# Patient Record
Sex: Female | Born: 1935 | Race: White | Hispanic: No | Marital: Married | Smoking: Never smoker
Health system: Southern US, Community
[De-identification: ages and names within clinical notes are randomized; demographics above are authoritative.]

## PROBLEM LIST (undated history)

## (undated) DIAGNOSIS — Z800 Family history of malignant neoplasm of digestive organs: Secondary | ICD-10-CM

## (undated) DIAGNOSIS — E785 Hyperlipidemia, unspecified: Secondary | ICD-10-CM

## (undated) DIAGNOSIS — K635 Polyp of colon: Secondary | ICD-10-CM

## (undated) DIAGNOSIS — I10 Essential (primary) hypertension: Secondary | ICD-10-CM

## (undated) DIAGNOSIS — K589 Irritable bowel syndrome without diarrhea: Secondary | ICD-10-CM

## (undated) HISTORY — PX: OTHER SURGICAL HISTORY: SHX169

## (undated) HISTORY — DX: Hyperlipidemia, unspecified: E78.5

## (undated) HISTORY — PX: CARPAL TUNNEL RELEASE: SHX101

## (undated) HISTORY — PX: CATARACT EXTRACTION, BILATERAL: SHX1313

## (undated) HISTORY — PX: KNEE ARTHROSCOPY: SUR90

## (undated) HISTORY — PX: TONSILLECTOMY AND ADENOIDECTOMY: SUR1326

## (undated) HISTORY — DX: Polyp of colon: K63.5

## (undated) HISTORY — DX: Essential (primary) hypertension: I10

## (undated) HISTORY — DX: Irritable bowel syndrome, unspecified: K58.9

## (undated) HISTORY — DX: Family history of malignant neoplasm of digestive organs: Z80.0

## (undated) HISTORY — PX: MASTOIDECTOMY: SHX711

---

## 1998-02-25 ENCOUNTER — Other Ambulatory Visit: Admission: RE | Admit: 1998-02-25 | Discharge: 1998-02-25 | Payer: Self-pay | Admitting: Obstetrics and Gynecology

## 1999-06-06 ENCOUNTER — Ambulatory Visit (HOSPITAL_COMMUNITY): Admission: RE | Admit: 1999-06-06 | Discharge: 1999-06-06 | Payer: Self-pay | Admitting: Orthopedic Surgery

## 1999-09-11 ENCOUNTER — Other Ambulatory Visit: Admission: RE | Admit: 1999-09-11 | Discharge: 1999-09-11 | Payer: Self-pay | Admitting: Gastroenterology

## 2000-10-07 ENCOUNTER — Other Ambulatory Visit: Admission: RE | Admit: 2000-10-07 | Discharge: 2000-10-07 | Payer: Self-pay | Admitting: Gastroenterology

## 2001-10-27 ENCOUNTER — Other Ambulatory Visit: Admission: RE | Admit: 2001-10-27 | Discharge: 2001-10-27 | Payer: Self-pay | Admitting: Gastroenterology

## 2002-11-18 ENCOUNTER — Other Ambulatory Visit: Admission: RE | Admit: 2002-11-18 | Discharge: 2002-11-18 | Payer: Self-pay | Admitting: Gastroenterology

## 2003-01-28 ENCOUNTER — Ambulatory Visit (HOSPITAL_COMMUNITY): Admission: RE | Admit: 2003-01-28 | Discharge: 2003-01-28 | Payer: Self-pay | Admitting: Gastroenterology

## 2005-02-16 ENCOUNTER — Other Ambulatory Visit: Admission: RE | Admit: 2005-02-16 | Discharge: 2005-02-16 | Payer: Self-pay | Admitting: Gastroenterology

## 2006-02-18 ENCOUNTER — Other Ambulatory Visit: Admission: RE | Admit: 2006-02-18 | Discharge: 2006-02-18 | Payer: Self-pay | Admitting: Gastroenterology

## 2007-07-31 ENCOUNTER — Ambulatory Visit (HOSPITAL_COMMUNITY): Admission: RE | Admit: 2007-07-31 | Discharge: 2007-07-31 | Payer: Self-pay | Admitting: Orthopedic Surgery

## 2007-07-31 ENCOUNTER — Encounter (INDEPENDENT_AMBULATORY_CARE_PROVIDER_SITE_OTHER): Payer: Self-pay | Admitting: Orthopedic Surgery

## 2007-10-23 DIAGNOSIS — K635 Polyp of colon: Secondary | ICD-10-CM

## 2007-10-23 HISTORY — DX: Polyp of colon: K63.5

## 2007-11-03 ENCOUNTER — Encounter: Payer: Self-pay | Admitting: Internal Medicine

## 2008-04-12 ENCOUNTER — Encounter: Payer: Self-pay | Admitting: Internal Medicine

## 2008-04-12 ENCOUNTER — Other Ambulatory Visit: Admission: RE | Admit: 2008-04-12 | Discharge: 2008-04-12 | Payer: Self-pay | Admitting: Gastroenterology

## 2008-04-30 ENCOUNTER — Emergency Department (HOSPITAL_COMMUNITY): Admission: EM | Admit: 2008-04-30 | Discharge: 2008-04-30 | Payer: Self-pay | Admitting: Family Medicine

## 2008-05-07 ENCOUNTER — Emergency Department (HOSPITAL_COMMUNITY): Admission: EM | Admit: 2008-05-07 | Discharge: 2008-05-07 | Payer: Self-pay | Admitting: Emergency Medicine

## 2008-08-25 ENCOUNTER — Encounter: Payer: Self-pay | Admitting: Internal Medicine

## 2008-08-25 LAB — HM COLONOSCOPY

## 2008-11-08 ENCOUNTER — Ambulatory Visit: Payer: Self-pay | Admitting: Internal Medicine

## 2008-11-08 DIAGNOSIS — Z86010 Personal history of colonic polyps: Secondary | ICD-10-CM

## 2008-11-08 DIAGNOSIS — E785 Hyperlipidemia, unspecified: Secondary | ICD-10-CM

## 2008-11-08 DIAGNOSIS — I10 Essential (primary) hypertension: Secondary | ICD-10-CM

## 2008-11-08 DIAGNOSIS — Z961 Presence of intraocular lens: Secondary | ICD-10-CM

## 2008-11-08 DIAGNOSIS — Z9889 Other specified postprocedural states: Secondary | ICD-10-CM

## 2008-11-08 DIAGNOSIS — K589 Irritable bowel syndrome without diarrhea: Secondary | ICD-10-CM | POA: Insufficient documentation

## 2008-11-09 ENCOUNTER — Encounter: Payer: Self-pay | Admitting: Internal Medicine

## 2008-11-20 ENCOUNTER — Emergency Department (HOSPITAL_COMMUNITY): Admission: EM | Admit: 2008-11-20 | Discharge: 2008-11-20 | Payer: Self-pay | Admitting: Emergency Medicine

## 2008-11-23 ENCOUNTER — Telehealth (INDEPENDENT_AMBULATORY_CARE_PROVIDER_SITE_OTHER): Payer: Self-pay | Admitting: *Deleted

## 2008-11-24 ENCOUNTER — Ambulatory Visit: Payer: Self-pay | Admitting: Internal Medicine

## 2008-11-24 DIAGNOSIS — R55 Syncope and collapse: Secondary | ICD-10-CM

## 2008-12-02 ENCOUNTER — Telehealth (INDEPENDENT_AMBULATORY_CARE_PROVIDER_SITE_OTHER): Payer: Self-pay | Admitting: *Deleted

## 2009-01-31 ENCOUNTER — Ambulatory Visit: Payer: Self-pay | Admitting: Internal Medicine

## 2009-02-02 LAB — CONVERTED CEMR LAB: Potassium: 4.9 meq/L (ref 3.5–5.1)

## 2009-02-03 ENCOUNTER — Encounter (INDEPENDENT_AMBULATORY_CARE_PROVIDER_SITE_OTHER): Payer: Self-pay | Admitting: *Deleted

## 2009-03-02 ENCOUNTER — Telehealth (INDEPENDENT_AMBULATORY_CARE_PROVIDER_SITE_OTHER): Payer: Self-pay | Admitting: *Deleted

## 2009-09-12 ENCOUNTER — Telehealth (INDEPENDENT_AMBULATORY_CARE_PROVIDER_SITE_OTHER): Payer: Self-pay | Admitting: *Deleted

## 2009-09-19 ENCOUNTER — Ambulatory Visit: Payer: Self-pay | Admitting: Internal Medicine

## 2009-09-19 LAB — CONVERTED CEMR LAB
AST: 16 units/L (ref 0–37)
Basophils Absolute: 0.1 10*3/uL (ref 0.0–0.1)
Bilirubin, Direct: 0.1 mg/dL (ref 0.0–0.3)
Creatinine, Ser: 0.8 mg/dL (ref 0.4–1.2)
Direct LDL: 116.9 mg/dL
Eosinophils Absolute: 0.2 10*3/uL (ref 0.0–0.7)
GFR calc non Af Amer: 73.06 mL/min (ref >60)
Hemoglobin: 13.3 g/dL (ref 12.0–15.0)
MCHC: 33.7 g/dL (ref 30.0–36.0)
MCV: 90.3 fL (ref 78.0–100.0)
Monocytes Absolute: 0.7 10*3/uL (ref 0.1–1.0)
Monocytes Relative: 15.8 % — ABNORMAL HIGH (ref 3.0–12.0)
RBC: 4.36 M/uL (ref 3.87–5.11)
TSH: 2.21 microintl units/mL (ref 0.35–5.50)
Total Protein: 6.5 g/dL (ref 6.0–8.3)
Triglycerides: 64 mg/dL (ref 0.0–149.0)
VLDL: 12.8 mg/dL (ref 0.0–40.0)
WBC: 4.2 10*3/uL — ABNORMAL LOW (ref 4.5–10.5)

## 2009-09-23 ENCOUNTER — Ambulatory Visit: Payer: Self-pay | Admitting: Internal Medicine

## 2009-09-23 LAB — CONVERTED CEMR LAB: HDL goal, serum: 40 mg/dL

## 2009-09-29 ENCOUNTER — Telehealth (INDEPENDENT_AMBULATORY_CARE_PROVIDER_SITE_OTHER): Payer: Self-pay | Admitting: *Deleted

## 2009-10-25 ENCOUNTER — Telehealth (INDEPENDENT_AMBULATORY_CARE_PROVIDER_SITE_OTHER): Payer: Self-pay | Admitting: *Deleted

## 2010-01-23 ENCOUNTER — Telehealth (INDEPENDENT_AMBULATORY_CARE_PROVIDER_SITE_OTHER): Payer: Self-pay | Admitting: *Deleted

## 2010-01-30 ENCOUNTER — Ambulatory Visit: Payer: Self-pay | Admitting: Family Medicine

## 2010-02-01 LAB — CONVERTED CEMR LAB
BUN: 14 mg/dL (ref 6–23)
CO2: 30 meq/L (ref 19–32)
Chloride: 101 meq/L (ref 96–112)
GFR calc non Af Amer: 73 mL/min (ref >60)

## 2010-02-03 ENCOUNTER — Ambulatory Visit: Payer: Self-pay | Admitting: Family Medicine

## 2010-02-13 ENCOUNTER — Telehealth (INDEPENDENT_AMBULATORY_CARE_PROVIDER_SITE_OTHER): Payer: Self-pay | Admitting: *Deleted

## 2010-02-14 ENCOUNTER — Ambulatory Visit: Payer: Self-pay | Admitting: Internal Medicine

## 2010-02-14 DIAGNOSIS — H7290 Unspecified perforation of tympanic membrane, unspecified ear: Secondary | ICD-10-CM | POA: Insufficient documentation

## 2010-02-15 ENCOUNTER — Encounter: Payer: Self-pay | Admitting: Internal Medicine

## 2010-03-09 ENCOUNTER — Encounter: Payer: Self-pay | Admitting: Internal Medicine

## 2010-03-13 ENCOUNTER — Telehealth (INDEPENDENT_AMBULATORY_CARE_PROVIDER_SITE_OTHER): Payer: Self-pay | Admitting: *Deleted

## 2010-03-14 ENCOUNTER — Telehealth (INDEPENDENT_AMBULATORY_CARE_PROVIDER_SITE_OTHER): Payer: Self-pay | Admitting: *Deleted

## 2010-03-17 ENCOUNTER — Telehealth (INDEPENDENT_AMBULATORY_CARE_PROVIDER_SITE_OTHER): Payer: Self-pay | Admitting: *Deleted

## 2010-03-24 ENCOUNTER — Telehealth (INDEPENDENT_AMBULATORY_CARE_PROVIDER_SITE_OTHER): Payer: Self-pay | Admitting: *Deleted

## 2010-06-02 ENCOUNTER — Telehealth (INDEPENDENT_AMBULATORY_CARE_PROVIDER_SITE_OTHER): Payer: Self-pay | Admitting: *Deleted

## 2010-06-20 ENCOUNTER — Encounter: Payer: Self-pay | Admitting: Internal Medicine

## 2010-09-19 ENCOUNTER — Telehealth (INDEPENDENT_AMBULATORY_CARE_PROVIDER_SITE_OTHER): Payer: Self-pay | Admitting: *Deleted

## 2010-11-16 ENCOUNTER — Other Ambulatory Visit: Payer: Self-pay | Admitting: Internal Medicine

## 2010-11-16 ENCOUNTER — Ambulatory Visit
Admission: RE | Admit: 2010-11-16 | Discharge: 2010-11-16 | Payer: Self-pay | Source: Home / Self Care | Attending: Internal Medicine | Admitting: Internal Medicine

## 2010-11-16 ENCOUNTER — Telehealth: Payer: Self-pay | Admitting: Internal Medicine

## 2010-11-16 LAB — BASIC METABOLIC PANEL
CO2: 29 mEq/L (ref 19–32)
Creatinine, Ser: 0.9 mg/dL (ref 0.4–1.2)
Glucose, Bld: 83 mg/dL (ref 70–99)
Potassium: 5.2 mEq/L — ABNORMAL HIGH (ref 3.5–5.1)

## 2010-11-16 LAB — CBC WITH DIFFERENTIAL/PLATELET
HCT: 39.6 % (ref 36.0–46.0)
Lymphocytes Relative: 29.1 % (ref 12.0–46.0)
MCHC: 34.2 g/dL (ref 30.0–36.0)
MCV: 88.6 fl (ref 78.0–100.0)
Neutro Abs: 2.4 10*3/uL (ref 1.4–7.7)
Platelets: 228 10*3/uL (ref 150.0–400.0)
RBC: 4.47 Mil/uL (ref 3.87–5.11)
RDW: 13.5 % (ref 11.5–14.6)

## 2010-11-16 LAB — HEPATIC FUNCTION PANEL
ALT: 13 U/L (ref 0–35)
AST: 18 U/L (ref 0–37)
Total Bilirubin: 0.7 mg/dL (ref 0.3–1.2)

## 2010-11-16 LAB — LIPID PANEL
HDL: 69.9 mg/dL (ref >39.00)
LDL Cholesterol: 110 mg/dL — ABNORMAL HIGH (ref 0–99)
Total CHOL/HDL Ratio: 3
Triglycerides: 85 mg/dL (ref 0.0–149.0)

## 2010-11-16 LAB — TSH: TSH: 2.38 u[IU]/mL (ref 0.35–5.50)

## 2010-11-21 NOTE — Progress Notes (Signed)
  Phone Note Call from Patient Call back at Home Phone [phone] Call back at Work Phone [phone]   Caller: Patient Summary of Call: pt called flying to [place] this weekend. Had concernes about her ears, seen 02/03/10 and had wax removed.  --No infection and ears feel fine now, wanted to know ok to fly, pt has Cortisporin drops, has appt sched wed 02/15/10 with Dr Alwyn Ren. Informed pt can chew gum for popping of ears, also has abx for ears can take with her in case of symptoms, Has office visit with Dr Alwyn Ren on Wed,  can discuss further options, wife agreed .Kandice Hams  February 13, 2010 4:53 PM  Initial call taken by: Kandice Hams,  February 13, 2010 4:54 PM

## 2010-11-21 NOTE — Progress Notes (Signed)
Summary: REFILL REQUEST  Phone Note Refill Request Message from:  Patient on June 02, 2010 11:28 AM  Refills Requested: Medication #1:  LOSARTAN POTASSIUM 100 MG TABS 1 once daily.   Dosage confirmed as above?Dosage Confirmed   Supply Requested: 3 months   Last Refilled: 03/17/2010 [facility]  Next Appointment Scheduled: NONE Initial call taken by: Lavell Islam,  June 02, 2010 11:29 AM    Prescriptions: LOSARTAN POTASSIUM 100 MG TABS (LOSARTAN POTASSIUM) 1 once daily  #90 x 2   Entered by:   Shonna Chock CMA   Authorized by:   Marga Melnick MD   Signed by:   Shonna Chock CMA on 06/02/2010   Method used:   Faxed to ...       [facility] (mail-order)             , [place]         Ph: [phone]       Fax: [phone]   RxID:   [id number]

## 2010-11-21 NOTE — Progress Notes (Signed)
Summary: RAMIPRIL CAPS 10 MG REFILL  Phone Note Call from Patient Call back at Home Phone [phone]   Caller: Patient Summary of Call: PATIENT DOES NEED YOU TO FAX A REQUEST TO [facility] FOR HER RAMIPRIL 10 MG FOR 90 DAYS PLUS 3 REFILLS---BOTTLE FROM [facility] DATED 10/27/2009 STATES "0 REFILLS" FOR THIS MEDICATION---PLEASE FAX IT TODAY  WILL WAIT TO REQUEST LOCAL REFILL--MAY NOT BE NECESSARY  WILL SEND [facility] PAPERWORK TO [place] [facility] [place] Initial call taken by: Jerolyn Shin,  January 23, 2010 3:39 PM    Prescriptions: ALTACE 10 MG CAPS (RAMIPRIL) 1 by mouth once daily  #90 x 2   Entered by:   Shonna Chock   Authorized by:   Marga Melnick MD   Signed by:   Shonna Chock on 01/23/2010   Method used:   Printed then faxed to ...       [facility] (mail-order)             ,          Ph: [phone]       Fax: [phone]   RxID:   [id number]

## 2010-11-21 NOTE — Progress Notes (Signed)
Summary: Medication Concerns  Phone Note From Pharmacy Call back at [phone] (2)   Caller: [facility] Summary of Call: Message left on VM: Calling to address Altace RX- Ref Number: [id number]  1.) Instruction indicated two times a day and due to age the pharmacy recommends 1 once daily or a different Med  2.) ? Dispense number, if med was for two times a day why was patient only given #90 instead of #180?  Chrae Malloy  Mar 17, 2010 11:15 AM   Follow-up for Phone Call        Dr.Hopper please advise if you are ok with patient taking med two times a day or will you change to another med? Follow-up by: Shonna Chock,  Mar 17, 2010 11:16 AM  Additional Follow-up for Phone Call Additional follow up Details #1::        left message to call office...................Marland KitchenFelecia Deloach CMA  Mar 17, 2010 2:43 PM  left message to call  office.........Marland KitchenFelecia Deloach CMA  Mar 21, 2010 10:21 AM   LEFT MESSAGE FOR PT TO CALL IN AM but advise pt husband to have pt take 1 tab instead of 2..............Marland KitchenFelecia Deloach CMA  Mar 21, 2010 5:07 PM     Additional Follow-up for Phone Call Additional follow up Details #2::    Pt states that she just received #60 of A LTACE 10 MG CAPS 2 by mouth once daily from local pharmacy and would like to know if she needs to continue this med until she received new med in mail. pls advise...Marland KitchenMarland KitchenFelecia Deloach CMA  Mar 21, 2010 4:43 PM    Additional Follow-up for Phone Call Additional follow up Details #3:: Details for Additional Follow-up Action Taken: continue Altace 10 mg once daily until new Rx comes in  [place] with pt this am, informed of Dr Alwyn Ren recommendations, take 1 Altace until new rx comes in.  Pt agreed .Kandice Hams  March 22, 2010 8:47 AM  Additional Follow-up by: Marga Melnick MD,  Mar 21, 2010 5:06 PM  New/Updated Medications: LOSARTAN POTASSIUM 100 MG TABS (LOSARTAN POTASSIUM) 1 once daily Prescriptions: LOSARTAN POTASSIUM 100 MG TABS  (LOSARTAN POTASSIUM) 1 once daily  #90 x 0   Entered by:   Jeremy Johann CMA   Authorized by:   Marga Melnick MD   Signed by:   Jeremy Johann CMA on 03/17/2010   Method used:   Faxed to ...       [facility] (mail-order)             ,          Ph: [phone]       Fax: [phone]   RxID:   [id number]

## 2010-11-21 NOTE — Progress Notes (Signed)
Summary: Refill Request  Phone Note Refill Request Message from:  Patient  Refills Requested: Medication #1:  ALTACE 10 MG CAPS 1 by mouth once daily [facility]   Method Requested: Fax to [facility] Initial call taken by: Shonna Chock,  October 25, 2009 10:27 AM    Prescriptions: ALTACE 10 MG CAPS (RAMIPRIL) 1 by mouth once daily  #90 x 3   Entered by:   Shonna Chock   Authorized by:   Marga Melnick MD   Signed by:   Shonna Chock on 10/25/2009   Method used:   Faxed to ...       [facility] (mail-order)             ,          Ph: [phone]       Fax: [phone]   RxID:   [id number]

## 2010-11-21 NOTE — Progress Notes (Signed)
Summary: Altace rx  Phone Note Refill Request Call back at Work Phone [phone] Call back at cell   Refills Requested: Medication #1:  ALTACE 10 MG CAPS 2 by mouth once daily pt called in ref to Altace rx--; will be using [facility] but cannot fill until 03/31/10. Has been doubling up on med because rx is now two times a day. Wanted rx for 12 days. Called and spoke with pharmacist, who says it is Still a $10 copay for 24 pills or 60.  left msg for pt will leave rx for 60 pills and pharmacist has rx ready. will send refills to [facility]  Initial call taken by: Kandice Hams,  Mar 14, 2010 4:39 PM Caller: Patient    Prescriptions: ALTACE 10 MG CAPS (RAMIPRIL) 2 by mouth once daily  #90 x 0   Entered by:   Kandice Hams   Authorized by:   Marga Melnick MD   Signed by:   Kandice Hams on 03/14/2010   Method used:   Re-Faxed to ...       [facility] (mail-order)             ,          Ph: [phone]       Fax: [phone]   RxID:   [id number]

## 2010-11-21 NOTE — Progress Notes (Signed)
Summary: losartan recevied   Phone Note Call from Patient   Summary of Call: Pt called and left a voicemail stating she received the Losartan today. Pt will start medication tomorrow, she will call and let us know if she has any problems with this medication. Army Fossa CMA  March 24, 2010 11:48 AM

## 2010-11-21 NOTE — Assessment & Plan Note (Signed)
Summary: elevated bp/alr   Vital Signs:  Patient profile:   77 year old female Height:      65.6 inches Weight:      162 pounds BMI:     26.56 Pulse rate:   74 / minute BP sitting:   150 / 84  (left arm)  Vitals Entered By: Doristine Devoid (January 30, 2010 3:53 PM) CC: elevated bp was 208/136 this am  Comments -rechecked bp w/ patient cuff at office visit was 155/98   History of Present Illness: 77 yo woman here today for elevated BP.  took BP yesterday and it was over 200- admits to being nervous prior to reading.  pt's cuff reasonably correlates w/ office reading (150/84 office vs 155/98 pt's cuff).  pt reports she was cleaning the fridge  last week and was leaning forward for at least 15 minutes and felt she was going to pass out.  when she checked BP reading it was high but pt says she was panicking about feeling faint.  no CP, SOB, HAs, visual changes.  has felt as if 'my head is in a vice'.  will go walking and feels 'great'.  also complains of ears feeling 'full'.  has had trouble w/ wax in past.  some pain in R ear.  Current Medications (verified): 1)  Altace 10 Mg Caps (Ramipril) .Marland Kitchen.. 1 By Mouth Once Daily 2)  Lipitor 10 Mg Tabs (Atorvastatin Calcium) .Marland Kitchen.. 1 By Mouth Once Daily 3)  Triamterene-Hctz 37.5-25 Mg Tabs (Triamterene-Hctz) .... 1/2 By Mouth Once Daily 4)  Adult Aspirin Low Strength 81 Mg Tbdp (Aspirin) .... 2 By Mouth At Bedtime 5)  Toprol Xl 50 Mg Xr24h-Tab (Metoprolol Succinate) .Marland Kitchen.. 1 By Mouth Once Daily 6)  Cortisporin-Tc 3.12-22-08-0.5 Mg/ml Susp (Neomycin-Colist-Hc-Thonzonium) .... 5 Drops in Affected Ear Three Times A Day X7 Days  Allergies (verified): No Known Drug Allergies  Past History:  Past Medical History: Last updated: 11/08/2008 Colonic polyps, hx of Hyperlipidemia Hypertension IBS  Social History: Last updated: 09/23/2009 Married Never Smoked Alcohol use-yes Walking 1.5 - 4X/week   Review of Systems General:  Denies chills,  fatigue, fever, and malaise. Eyes:  Denies blurring and double vision. ENT:  Complains of decreased hearing, earache, and ringing in ears; denies ear discharge. CV:  Denies chest pain or discomfort, fainting, palpitations, shortness of breath with exertion, swelling of feet, and swelling of hands. Resp:  Denies cough. GI:  Denies nausea and vomiting. Neuro:  Complains of headaches.  Physical Exam  General:  Appears younger than age,in no acute distress; alert,appropriate and cooperative throughout examination Head:  NCAT Eyes:  PERRL, EOMI, fundi WNL Ears:  TMs obscurred by wax bilaterally.  Attempts at currette unsuccessful  s/p irrigation and wax removal on R canal is red, some blood, fluid bubble vs circular TM perforation on central TM.  TM otherwise WNL Neck:  No deformities, masses, or tenderness noted.  Lungs:  Normal respiratory effort, chest expands symmetrically. Lungs are clear to auscultation, no crackles or wheezes. Heart:  normal rate, regular rhythm, no gallop, no rub, no JVD, no HJR, and grade 1/6 systolic murmur.   Pulses:  R and L carotid,radial,dorsalis pedis and posterior tibial pulses are full and equal bilaterally Extremities:  No clubbing, cyanosis, edema.   Impression & Recommendations:  Problem # 1:  HYPERTENSION (ICD-401.9) Assessment Deteriorated having episodes of elevated BP.  pt very anxious about this.  increase Altace to 2 pills daily.  EKG w/out acute findings.  check BMP to  assess renal fxn.  reviewed red flags that should prompt immediate return- pt aware and in agreement. Her updated medication list for this problem includes:    Altace 10 Mg Caps (Ramipril) .Marland Kitchen... 1 by mouth once daily    Triamterene-hctz 37.5-25 Mg Tabs (Triamterene-hctz) .Marland Kitchen... 1/2 by mouth once daily    Toprol Xl 50 Mg Xr24h-tab (Metoprolol succinate) .Marland Kitchen... 1 by mouth once daily  Orders: Venipuncture (65784) EKG w/ Interpretation (93000) TLB-BMP (Basic Metabolic Panel-BMET)  (80048-METABOL)  Problem # 2:  IMPACTED CERUMEN (ICD-380.4) Assessment: Unchanged  attempts at currette were unsuccessful- ears irrigated bilaterally.  R ear now clear but canal red, painful, and w/ fluid bubble vs TM over central TM.  tx as otitis externa.  L ear unsuccessfully irrigated.  pt to use hydrogen peroxide at home to soften wax.  will recheck when pt returns at end of week for BP.  Pt expresses understanding and is in agreement w/ this plan.  Orders: Cerumen Impaction Removal (69629)  Complete Medication List: 1)  Altace 10 Mg Caps (Ramipril) .Marland Kitchen.. 1 by mouth once daily 2)  Lipitor 10 Mg Tabs (Atorvastatin calcium) .Marland Kitchen.. 1 by mouth once daily 3)  Triamterene-hctz 37.5-25 Mg Tabs (Triamterene-hctz) .... 1/2 by mouth once daily 4)  Adult Aspirin Low Strength 81 Mg Tbdp (Aspirin) .... 2 by mouth at bedtime 5)  Toprol Xl 50 Mg Xr24h-tab (Metoprolol succinate) .Marland Kitchen.. 1 by mouth once daily 6)  Cortisporin-tc 3.12-22-08-0.5 Mg/ml Susp (Neomycin-colist-hc-thonzonium) .... 5 drops in affected ear three times a day x7 days  Patient Instructions: 1)  Please schedule a nurse visit for BP check on Friday 2)  Follow up with Dr Alwyn Ren in 2 weeks to check blood pressure and repeat labs 3)  Check your blood pressure no more than once a day unless you are having symptoms 4)  Take 2 Altace pills 5)  If you have chest pain, shortness of breath, or other concerns- please go to the ER 6)  Hang in there!! Prescriptions: CORTISPORIN-TC 3.12-22-08-0.5 MG/ML SUSP (NEOMYCIN-COLIST-HC-THONZONIUM) 5 drops in affected ear three times a day x7 days  #31ml x 0   Entered and Authorized by:   Neena Rhymes MD   Signed by:   Neena Rhymes MD on 04/06/202011   Method used:   [facility]        [facility] [address] [phone]* (retail)       [address]       [place], [place]  [address]       Ph: [phone] or [phone]       Fax: [phone]   RxID:   [id number]

## 2010-11-21 NOTE — Progress Notes (Signed)
Summary: Refill Request  Phone Note Refill Request Call back at Home Phone [phone] Message from:  Patient  Refills Requested: Medication #1:  ALTACE 10 MG CAPS 2 by mouth once daily  Method Requested: Fax to Local Pharmacy Initial call taken by: Shonna Chock,  Mar 13, 2010 12:15 PM    Prescriptions: ALTACE 10 MG CAPS (RAMIPRIL) 2 by mouth once daily  #60 x 5   Entered by:   Shonna Chock   Authorized by:   Marga Melnick MD   Signed by:   Shonna Chock on 03/13/2010   Method used:   [facility]        [facility] [address] [phone]* (retail)       [address]       [place], [place]  [address]       Ph: [phone] or [phone]       Fax: [phone]   RxID:   [id number]

## 2010-11-21 NOTE — Progress Notes (Signed)
Summary: Refill Request  Phone Note Refill Request Call back at Home Phone [phone] Message from:  Patient  Refills Requested: Medication #1:  LIPITOR 10 MG TABS 1 by mouth once daily [facility]   Method Requested: Fax to [facility] Initial call taken by: Shonna Chock CMA,  September 19, 2010 2:32 PM Caller: Patient    Prescriptions: LIPITOR 10 MG TABS (ATORVASTATIN CALCIUM) 1 by mouth once daily  #90 x 0   Entered by:   Shonna Chock CMA   Authorized by:   Marga Melnick MD   Signed by:   Shonna Chock CMA on 09/19/2010   Method used:   Faxed to ...       [facility] (retail)             ,          Ph: [phone]       Fax: [phone]   RxID:   [id number]

## 2010-11-21 NOTE — Assessment & Plan Note (Signed)
Summary: 2 WEEK FOLLOWUP FOR BP AND REPEAT LABS FROM 01/30/10///[facility]   Vital Signs:  Patient profile:   77 year old female Weight:      163.8 pounds Temp:     98.2 degrees F oral Pulse rate:   64 / minute Resp:     16 per minute BP sitting:   132 / 80  (left arm) Cuff size:   large  Vitals Entered By: Shonna Chock (February 14, 2010 12:05 PM) CC: Follow-up visit: B/P and recheck labs  Comments REVIEWED MED LIST, PATIENT AGREED DOSE AND INSTRUCTION CORRECT    CC:  Follow-up visit: B/P and recheck labs .  History of Present Illness: Persistant otic issues as L cerumen impaction with hearing loss, echoing of heart beat as well as sounds despite H2O2 X 11 days. No tinnitus or pain . Lightheaded @ R cerumen impaction removal 01/30/2010. S/P Cortisporin Otic X 4 days helped. She is off balance & fears falling. She is to fly to [place] [employer] 04/28.PMH of L mastoidectomy in 1930.  Allergies (verified): No Known Drug Allergies  Review of Systems General:  Denies chills, fever, and sweats. ENT:  See HPI; Denies nasal congestion, postnasal drainage, sinus pressure, and sore throat. Allergy:  Complains of itching eyes and sneezing.  Physical Exam  General:  Appears younger than age,in no acute distress; alert,appropriate and cooperative throughout examination Eyes:  No corneal or conjunctival inflammation noted. EOMI w/o nystagmus. Perrla. Marked ptosis , OS > OD. Ears:  Small perforation R TM; impaction on L Nose:  External nasal examination shows no deformity or inflammation. Nasal mucosa are pink and moist without lesions or exudates. Mouth:  Oral mucosa and oropharynx without lesions or exudates.  Teeth in good repair. Cervical Nodes:  No lymphadenopathy noted Axillary Nodes:  No palpable lymphadenopathy   Impression & Recommendations:  Problem # 1:  CERUMEN IMPACTION (ICD-380.4)  with decreased hearing; severe dizziness after R cerumen impaction removal  Orders: ENT  Referral (ENT)  Problem # 2:  PERFORATED TYMPANIC MEMBRANE (ICD-384.20) R TM perforated, ? age  Complete Medication List: 1)  Altace 10 Mg Caps (Ramipril) .... 2 by mouth once daily 2)  Lipitor 10 Mg Tabs (Atorvastatin calcium) .Marland Kitchen.. 1 by mouth once daily 3)  Triamterene-hctz 37.5-25 Mg Tabs (Triamterene-hctz) .... 1/2 by mouth once daily 4)  Adult Aspirin Low Strength 81 Mg Tbdp (Aspirin) .... 2 by mouth at bedtime 5)  Toprol Xl 50 Mg Xr24h-tab (Metoprolol succinate) .Marland Kitchen.. 1 by mouth once daily 6)  Cortisporin-tc 3.12-22-08-0.5 Mg/ml Susp (Neomycin-colist-hc-thonzonium) .... 5 drops in affected ear three times a day x7 days  Patient Instructions: 1)  3 drops of mineral oil into L  ear at bedtime ; cover with cotton ball. In am fill L ear with hydrogen peroxide for 10-15 minutes ; then shower, using  a very thin washrag to wick out  softened wax.

## 2010-11-21 NOTE — Consult Note (Signed)
Summary: [facility]  [facility]   Imported By: Lanelle Bal 02/21/2010 09:05:15  _____________________________________________________________________  External Attachment:    Type:   Image     Comment:   External Document

## 2010-11-21 NOTE — Letter (Signed)
Summary: [facility]  [facility]   Imported By: Lanelle Bal 06/29/2010 12:19:47  _____________________________________________________________________  External Attachment:    Type:   Image     Comment:   External Document

## 2010-11-21 NOTE — Assessment & Plan Note (Signed)
Summary: BP CHECK PER DR Rees Santistevan///[facility]  Nurse Visit   Vital Signs:  Patient profile:   77 year old female Weight:      161 pounds Pulse rate:   74 / minute BP sitting:   136 / 84  (left arm)  Vitals Entered By: Doristine Devoid (February 03, 2010 1:30 PM)  History of Present Illness: 77 yo woman here today to check BP.  improved since doubling ACE.  pt's cuff didn't correlate today- was 161/90s w/ her home cuff.  denies CP, SOB, HAs, dizziness.  pt still c/o clogged ears.  using peroxide on L and abx drops on R.  no dizziness or pain.   Review of Systems      See HPI   Patient Instructions: 1)  Follow up w/ Dr Alwyn Ren as scheduled 2)  Continue the peroxide in the L ear 3)  Call with any questions or concerns 4)  I'm so glad you're feeling better- your pressure looks great!   Physical Exam  General:  Appears younger than age,in no acute distress; alert,appropriate and cooperative throughout examination Ears:  R TM retracted w/ scarring, EOC remains red but no longer tender. L TM obscurred by wax  CC: BP check    Allergies: No Known Drug Allergies  Orders Added: 1)  Est. Patient Level III [40981]  Impression & Recommendations:  Problem # 1:  HYPERTENSION (ICD-401.9) Assessment Unchanged BP improved since doubling Altace to 20mg .  will need BMP at next visit.  reassured pt that home cuff is inaccurate based on today's measurements.  pt relieved. Her updated medication list for this problem includes:    Altace 10 Mg Caps (Ramipril) .Marland Kitchen... 1 by mouth once daily    Triamterene-hctz 37.5-25 Mg Tabs (Triamterene-hctz) .Marland Kitchen... 1/2 by mouth once daily    Toprol Xl 50 Mg Xr24h-tab (Metoprolol succinate) .Marland Kitchen... 1 by mouth once daily  Problem # 2:  IMPACTED CERUMEN (ICD-380.4) Assessment: Unchanged R TM scarred and retracted, external canal still red but no longer painful.  L TM obscurred by wax- encouraged continued use of peroxide.  Complete Medication List: 1)  Altace 10 Mg  Caps (Ramipril) .Marland Kitchen.. 1 by mouth once daily 2)  Lipitor 10 Mg Tabs (Atorvastatin calcium) .Marland Kitchen.. 1 by mouth once daily 3)  Triamterene-hctz 37.5-25 Mg Tabs (Triamterene-hctz) .... 1/2 by mouth once daily 4)  Adult Aspirin Low Strength 81 Mg Tbdp (Aspirin) .... 2 by mouth at bedtime 5)  Toprol Xl 50 Mg Xr24h-tab (Metoprolol succinate) .Marland Kitchen.. 1 by mouth once daily 6)  Cortisporin-tc 3.12-22-08-0.5 Mg/ml Susp (Neomycin-colist-hc-thonzonium) .... 5 drops in affected ear three times a day x7 days

## 2010-11-23 NOTE — Progress Notes (Signed)
Summary[facility]  Phone Note Call from Patient   Caller: Patient Summary of Call: patient says that she does NOT need the [facility] today---please cancel the prescriptioni for the Lipitor---it is OK for the Triamterene----she has 59 pills of her Lipitor left so she will call [facility] at end of Feb when she is in [place] to reorder her Lipitor Initial call taken by: Jerolyn Shin,  November 16, 2010 12:40 PM  Follow-up for Phone Call        Left message on voicemail notifying pharmacy. Follow-up by: Lucious Groves CMA,  November 16, 2010 2:23 PM

## 2010-11-23 NOTE — Assessment & Plan Note (Signed)
Summary: Yearly CPX/Fasting/scm   Vital Signs:  Patient profile:   77 year old female Height:      65.25 inches Weight:      162.6 pounds BMI:     26.95 Temp:     97.8 degrees F oral Pulse rate:   64 / minute Resp:     16 per minute BP sitting:   140 / 86  (left arm) Cuff size:   large  Vitals Entered By: Shonna Chock CMA (November 16, 2010 11:21 AM) CC: Yearly CPX/fasting labs, Last EKG 01/2010 and refill Lipitor and Triamterene/HCTZ , Lipid Management Comments Patient states Flu and Shingles vaccine UTD   Vision Screening:Left eye w/o correction: 20 / 30 Right Eye w/o correction: 20 / 40 Both eyes w/o correction:  20/ 25       Vision Comments: Patient wears reading glasses   Vision Entered By: Shonna Chock CMA (November 16, 2010 11:21 AM)   CC:  Yearly CPX/fasting labs, Last EKG 01/2010 and refill Lipitor and Triamterene/HCTZ , and Lipid Management.  History of Present Illness: Here for Medicare AWV: 1.Risk factors based on Past M, S, F history:see Diagnoses; chart updated 2.Physical Activities: walking 4-5X/week 30-60 min/mood:  4.Hearing: whisper heard @ 6 ft 5.ADL's: no limitations 6.Fall Risk: denied 7.Home Safety: no issues 8.Height, weight, &visual acuity:see VS 9.Counseling:  POA & Living Will in place (son is attorney) 10.Labs ordered based on risk factors: se Orders 11.  Referral Coordination: Colonoscopy survelliance must be verified; probably 09/2011.Dr Sherin Quarry has retired. His group does not take her insurance. 12. Care Plan: see Instructions 13.Cognitive Assessment: Oriented X 3; memory & recall   good   ;"WORLD"  spelled backwards; mood & affect intact.    Hyperlipidemia Follow-Up:  The patient denies muscle aches, GI upset, abdominal pain, flushing, itching, constipation, diarrhea, and fatigue.  The patient denies the following symptoms: chest pain/pressure, exercise intolerance, dypsnea, palpitations, syncope, and pedal edema.  Compliance with  medications (by patient report) has been near 100%.  Dietary compliance has been good.  The patient reports exercising 4-5 X per week.  Adjunctive measures currently used by the patient include fiber and ASA.     Hypertension Follow-Up: The patient denies lightheadedness, urinary frequency, and headaches.  Compliance with medications (by patient report) has been near 100%.  Adjunctive measures currently used by the patient include salt restriction.  BP not checked.  Lipid Management History:      Positive NCEP/ATP III risk factors include female age 26 years old or older, family history for ischemic heart disease (females less than 72 years old), and hypertension.  Negative NCEP/ATP III risk factors include no history of early menopause without estrogen hormone replacement, non-diabetic, HDL cholesterol greater than 60, non-tobacco-user status, no ASHD (atherosclerotic heart disease), no prior stroke/TIA, no peripheral vascular disease, and no history of aortic aneurysm.     Preventive Screening-Counseling & Management  Caffeine-Diet-Exercise     Caffeine use/day: 2 cups/ day  Hep-HIV-STD-Contraception     Dental Visit-last 6 months yes     Sun Exposure-Excessive: no  Safety-Violence-Falls     Seat Belt Use: yes     Smoke Detectors: yes      Blood Transfusions:  no.        Travel History:  09/2010 [place].    Current Medications (verified): 1)  Lipitor 10 Mg Tabs (Atorvastatin Calcium) .Marland Kitchen.. 1 By Mouth Once Daily 2)  Triamterene-Hctz 37.5-25 Mg Tabs (Triamterene-Hctz) .... 1/2 By Mouth Once Daily 3)  Adult Aspirin Low Strength 81 Mg Tbdp (Aspirin) .... 2 By Mouth At Bedtime 4)  Toprol Xl 50 Mg Xr24h-Tab (Metoprolol Succinate) .Marland Kitchen.. 1 By Mouth Once Daily 5)  Losartan Potassium 100 Mg Tabs (Losartan Potassium) .Marland Kitchen.. 1 Once Daily  Allergies (verified): No Known Drug Allergies  Past History:  Past Medical History: Colonic polyps, PMH  of, Dr Sherin Quarry Hyperlipidemia: Framingham Study  LDL goal = < 130. Sister had  MI in 22s. Hypertension IBS, PMH of  Past Surgical History: Tonsillectomy; Mastoid surgery; Arthroscopy L knee Carpal tunnel release ,2009 ,Dr  Fayrene Fearing [place] Cataract extraction bilaterally Colon polypectomy 08/25/2008, Dr  Tasia Catchings  Family History: Father: DECEASED-AGE 67 (MVA) Mother: DECEASED-AGE 24 (CEREBRAL HEMORRAGE), ?  CAD pre CVA Siblings: 4 SISTERS(ALL DECEASED): 2 colon cancer , 1 stomach cancer; 1 MI @ 65 Daughter: Hodgkin's Lymphoma; son: Mantle  cell  NHLymphoma  Social History: Married Never Smoked Alcohol use-yes: socially Walking 1.5 - 4-5 X/week Caffeine use/day:  2 cups/ day Dental Care w/in 6 mos.:  yes Sun Exposure-Excessive:  no Risk analyst Use:  yes Blood Transfusions:  no  Review of Systems  The patient denies anorexia, fever, weight loss, weight gain, hoarseness, melena, hematochezia, severe indigestion/heartburn, hematuria, suspicious skin lesions, depression, unusual weight change, abnormal bleeding, enlarged lymph nodes, and angioedema.    Physical Exam  General:  Appears younger than age,well-nourished;; alert,appropriate and cooperative throughout examination Head:  Normocephalic and atraumatic without obvious abnormalities.  Eyes:  No corneal or conjunctival inflammation noted.Perrla. Funduscopic exam benign, without hemorrhages, exudates or papilledema. . Ears:  External ear exam shows no significant lesions or deformities.  Otoscopic examination reveals clear canals, tympanic membranes are intact bilaterally without bulging, retraction, inflammation or discharge. Small amount of wax bilaterally Nose:  External nasal examination shows no deformity or inflammation. Nasal mucosa are pink and moist without lesions or exudates. Mouth:  Oral mucosa and oropharynx without lesions or exudates.  Teeth in good repair. Neck:  No deformities, masses, or tenderness noted. Lungs:  Normal respiratory effort, chest  expands symmetrically. Lungs are clear to auscultation, no crackles or wheezes. Heart:  regular rhythm, no murmur, no gallop, no rub, no JVD, no HJR, and bradycardia.   Abdomen:  Bowel sounds positive,abdomen soft and non-tender without masses, organomegaly or hernias noted. Genitalia:  Gyn recommended Pulses:  R and L carotid,radial,dorsalis pedis and posterior tibial pulses are full and equal bilaterally Extremities:  No clubbing, cyanosis, edema, or deformity noted with normal full range of motion of all joints.   Neurologic:  alert & oriented X3 and DTRs symmetrical and normal.   Skin:  Intact without suspicious lesions or rashes. Thin skin; keratoses Cervical Nodes:  No lymphadenopathy noted Axillary Nodes:  No palpable lymphadenopathy Psych:  memory intact for recent and remote, normally interactive, and good eye contact.     Impression & Recommendations:  Problem # 1:  PREVENTIVE HEALTH CARE (ICD-V70.0)  Orders: Medicare -1st Annual Wellness Visit [phone])  Problem # 2:  HYPERTENSION (ICD-401.9)  Her updated medication list for this problem includes:    Triamterene-hctz 37.5-25 Mg Tabs (Triamterene-hctz) .Marland Kitchen... 1/2 by mouth once daily    Toprol Xl 50 Mg Xr24h-tab (Metoprolol succinate) .Marland Kitchen... 1 by mouth once daily    Losartan Potassium 100 Mg Tabs (Losartan potassium) .Marland Kitchen... 1 once daily  Orders: Venipuncture (60454) TLB-BMP (Basic Metabolic Panel-BMET) (80048-METABOL) Prescription Created Electronically [phone])  Problem # 3:  HYPERLIPIDEMIA (ICD-272.4)  Her updated medication list for this problem includes:  Lipitor 10 Mg Tabs (Atorvastatin calcium) .Marland Kitchen... 1 by mouth once daily  Orders: Venipuncture ([address]) TLB-Lipid Panel (80061-LIPID) TLB-Hepatic/Liver Function Pnl (80076-HEPATIC) TLB-TSH (Thyroid Stimulating Hormone) (60454-UJW) Prescription Created Electronically [phone])  Problem # 4:  COLONIC POLYPS, HX OF (ICD-V12.72)  Orders: Venipuncture (78295) TLB-CBC  Platelet - w/Differential (85025-CBCD)  Complete Medication List: 1)  Lipitor 10 Mg Tabs (Atorvastatin calcium) .Marland Kitchen.. 1 by mouth once daily 2)  Triamterene-hctz 37.5-25 Mg Tabs (Triamterene-hctz) .... 1/2 by mouth once daily 3)  Adult Aspirin Low Strength 81 Mg Tbdp (Aspirin) .... 2 by mouth at bedtime 4)  Toprol Xl 50 Mg Xr24h-tab (Metoprolol succinate) .Marland Kitchen.. 1 by mouth once daily 5)  Losartan Potassium 100 Mg Tabs (Losartan potassium) .Marland Kitchen.. 1 once daily  Lipid Assessment/Plan:      Based on NCEP/ATP III, the patient's risk factor category is "0-1 risk factors".  The patient's lipid goals are as follows: Total cholesterol goal is 200; LDL cholesterol goal is 130; HDL cholesterol goal is 40; Triglyceride goal is 150.  Her LDL cholesterol goal has been met.    Patient Instructions: 1)  Complete stool cards. I'll verify colonoscopy survelliance protocol; it will probably be 09/2011 or 12/14. The type of polyp dictates interval. 2)  Check your Blood Pressure regularly. If it is above: 135/85 ON AVERAGE  you should  be seen. 3)  Schedule your mammogram annually. Gyn exam recommended every 24 months unless symptoms appear(pain, bloating, bleeding).. Prescriptions: LIPITOR 10 MG TABS (ATORVASTATIN CALCIUM) 1 by mouth once daily  #90 x 3   Entered and Authorized by:   Marga Melnick MD   Signed by:   Marga Melnick MD on 11/16/2010   Method used:   [facility]        [facility] [address] [phone]* (retail)       [address]       [place], [place]  [address]       Ph: [phone] or [phone]       Fax: [phone]   RxID:   [id number] TRIAMTERENE-HCTZ 37.5-25 MG TABS (TRIAMTERENE-HCTZ) 1/2 by mouth once daily  #90 x 3   Entered and Authorized by:   Marga Melnick MD   Signed by:   Marga Melnick MD on 11/16/2010   Method used:   [facility]        [facility] [address] [phone]* (retail)       [address]       [place], [place]  [address]       Ph: [phone] or [phone]       Fax: [phone]   RxID:   [id number]    Orders Added: 1)  Medicare -1st Annual Wellness Visit [G0438] 2)  Est. Patient Level III [10626] 3)  Venipuncture [94854] 4)  TLB-Lipid Panel [80061-LIPID] 5)  TLB-BMP (Basic Metabolic Panel-BMET) [80048-METABOL] 6)  TLB-CBC Platelet - w/Differential [85025-CBCD] 7)  TLB-Hepatic/Liver Function Pnl [80076-HEPATIC] 8)  TLB-TSH (Thyroid Stimulating Hormone) [84443-TSH] 9)  Prescription Created Electronically [phone]     Appended Document: Yearly CPX/Fasting/scm Polyps were HYPERPLASTIC ( reactive), not adenomatous polyps. Check stool cards annually. Colonoscopy 2014. [facility]opp  Appended Document: Yearly CPX/Fasting/scm mailed.

## 2010-12-18 ENCOUNTER — Encounter: Payer: Self-pay | Admitting: Internal Medicine

## 2010-12-18 ENCOUNTER — Encounter (INDEPENDENT_AMBULATORY_CARE_PROVIDER_SITE_OTHER): Payer: Self-pay | Admitting: *Deleted

## 2010-12-18 ENCOUNTER — Other Ambulatory Visit (INDEPENDENT_AMBULATORY_CARE_PROVIDER_SITE_OTHER): Payer: Medicare Other

## 2010-12-18 DIAGNOSIS — Z1211 Encounter for screening for malignant neoplasm of colon: Secondary | ICD-10-CM

## 2010-12-18 LAB — CONVERTED CEMR LAB: OCCULT 1: NEGATIVE

## 2010-12-20 ENCOUNTER — Telehealth (INDEPENDENT_AMBULATORY_CARE_PROVIDER_SITE_OTHER): Payer: Self-pay | Admitting: *Deleted

## 2010-12-28 NOTE — Progress Notes (Signed)
Summary: refill  Phone Note Refill Request Message from:  Patient  Refills Requested: Medication #1:  LIPITOR 10 MG TABS 1 by mouth once daily [facility] - 90 day supply  Initial call taken by: Okey Regal [place],  December 20, 2010 1:45 PM    Prescriptions: LIPITOR 10 MG TABS (ATORVASTATIN CALCIUM) 1 by mouth once daily  #90 x 3   Entered by:   Shonna Chock CMA   Authorized by:   Marga Melnick MD   Signed by:   Shonna Chock CMA on 12/20/2010   Method used:   Faxed to ...       [facility]* (retail)             ,          Ph: [phone]       Fax: [phone]   RxID:   [id number]

## 2010-12-28 NOTE — Letter (Signed)
Summary: Results Follow up Letter  [facility] [place]  [address] [place], [place] [address]   Phone: [phone]  Fax: [phone]    12/18/2010 MRN: [id number]  Madison Bradley [address] [place], [place]  [address]  [place]  Dear Madison Bradley,  The following are the results of your recent test(s):  Test         Result    Pap Smear:        Normal _____  Not Normal _____ Comments: ______________________________________________________ Cholesterol: LDL(Bad cholesterol):         Your goal is less than:         HDL (Good cholesterol):       Your goal is more than: Comments:  ______________________________________________________ Mammogram:        Normal _____  Not Normal _____ Comments:  ___________________________________________________________________ Hemoccult:        Normal _X____  Not normal _______ Comments:    _____________________________________________________________________ Other Tests:    We routinely do not discuss normal results over the telephone.  If you desire a copy of the results, or you have any questions about this information we can discuss them at your next office visit.   Sincerely,

## 2011-02-05 LAB — URINE MICROSCOPIC-ADD ON

## 2011-02-05 LAB — DIFFERENTIAL
Basophils Absolute: 0 10*3/uL (ref 0.0–0.1)
Basophils Relative: 0 % (ref 0–1)
Lymphocytes Relative: 11 % — ABNORMAL LOW (ref 12–46)
Lymphs Abs: 1.3 10*3/uL (ref 0.7–4.0)
Neutro Abs: 9 10*3/uL — ABNORMAL HIGH (ref 1.7–7.7)
Neutrophils Relative %: 78 % — ABNORMAL HIGH (ref 43–77)

## 2011-02-05 LAB — BASIC METABOLIC PANEL
BUN: 14 mg/dL (ref 6–23)
CO2: 26 mEq/L (ref 19–32)
Chloride: 96 mEq/L (ref 96–112)
GFR calc Af Amer: >60 mL/min (ref >60)
GFR calc non Af Amer: 53 mL/min — ABNORMAL LOW (ref >60)
Sodium: 133 mEq/L — ABNORMAL LOW (ref 135–145)

## 2011-02-05 LAB — CBC
HCT: 43.5 % (ref 36.0–46.0)
Hemoglobin: 14.5 g/dL (ref 12.0–15.0)
MCHC: 33.3 g/dL (ref 30.0–36.0)
Platelets: 230 10*3/uL (ref 150–400)
RDW: 13.2 % (ref 11.5–15.5)

## 2011-02-05 LAB — MAGNESIUM: Magnesium: 2.4 mg/dL (ref 1.5–2.5)

## 2011-02-05 LAB — URINALYSIS, ROUTINE W REFLEX MICROSCOPIC
Glucose, UA: NEGATIVE mg/dL
Protein, ur: NEGATIVE mg/dL
Specific Gravity, Urine: 1.012 (ref 1.005–1.030)
pH: 6.5 (ref 5.0–8.0)

## 2011-02-05 LAB — POCT CARDIAC MARKERS: Myoglobin, poc: 315 ng/mL (ref 12–200)

## 2011-02-05 LAB — TROPONIN I: Troponin I: <0.01 ng/mL (ref 0.00–0.06)

## 2011-03-06 NOTE — Op Note (Signed)
Madison Bradley, Madison Bradley            ACCOUNT NO.:  [id number]   MEDICAL RECORD NO.:  [id number]          PATIENT TYPE:  AMB   LOCATION:  DAY                          FACILITY:  [facility]   PHYSICIAN:  Marlowe Kays, M.D.  DATE OF BIRTH:  11/01/1925   DATE OF PROCEDURE:  07/31/2007  DATE OF DISCHARGE:                               OPERATIVE REPORT   PREOPERATIVE DIAGNOSIS:  Right carpal tunnel syndrome.   POSTOPERATIVE DIAGNOSES:  1. Right carpal tunnel syndrome.  2. Tenosynovitis flexor tendons, right wrist.   OPERATION:  1. Decompression median nerve right wrist and hand.  2. Tenosynovectomy right flexor wrist.   SURGEON:  Marlowe Kays, M.D.   ASSISTANT:  Nurse.   ANESTHESIA:  IV regional.   PATHOLOGY AND JUSTIFICATION FOR PROCEDURE:  She has had some swelling  over the flexor wrist with an MRI demonstrating some mild tenosynovitis.  Her major problem has been carpal tunnel syndrome documented by abnormal  nerve conduction studies.  We did have inflammatory studies performed  consisting of sed rate, RA screen and uric acid last May, all of which  were normal.   PROCEDURE:  Satisfactory IV regional anesthesia, DuraPrep from  midforearm to fingertips which was draped in sterile field.  Time-out  performed, made my usual carpal tunnel incision along the base of thenar  eminence crossing obliquely over the flexor crease of wrist in the  distal forearm. The median nerve was identified beneath the palmaris  longus tendon and I then progressively released the skin, subcutaneous  tissue and fascia into the distal palm.  Potential bleeders were  coagulated with bipolar cautery.  The median nerve did have the usual  picture of discoloration compression in the carpal canal.  In addition,  however adjacent to it on the ulnar side was a large cystic structure so  I had to open the carpal tunnel incision more proximal than normal and  with blunt dissection mainly with a hemostat, I then  opened the cyst.  There was a lot of clear fluid that came forth also had been some  evidence of hemorrhage around the nerve with some yellowish  discoloration consistent with old blood.  The synovium itself I  dissected out and removed from the flexor tendon sending several  specimens to pathology.  This did not appear to be a rheumatoid type  synovium.  When I was satisfied that I had thoroughly decompressed the  flexor tendons and median nerve from the cyst I then irrigated the wound  well with sterile saline and closed the skin, subcutaneous  tissue only with interrupted four nylon mattress sutures.  Betadine  Adaptic dry sterile dressing, volar plaster splint were applied.  Tourniquet was released.  She tolerated the procedure well and was taken  to recovery satisfactory with no known complications.  July 30, 2007           ______________________________  Marlowe Kays, M.D.     JA/MEDQ  D:  07/31/2007  T:  07/31/2007  Job:  [id number]

## 2011-03-08 ENCOUNTER — Telehealth: Payer: Self-pay | Admitting: Internal Medicine

## 2011-03-08 MED ORDER — LOSARTAN POTASSIUM 100 MG PO TABS: 100.0000 mg | ORAL_TABLET | Freq: Every day | ORAL | Status: DC

## 2011-03-08 NOTE — Telephone Encounter (Signed)
Patient needs new rx for losartan tab - 100 mg - [facility] - 3 month supply

## 2011-03-29 ENCOUNTER — Encounter: Payer: Self-pay | Admitting: Internal Medicine

## 2011-03-30 ENCOUNTER — Encounter: Payer: Self-pay | Admitting: Internal Medicine

## 2011-05-02 ENCOUNTER — Other Ambulatory Visit: Payer: Self-pay | Admitting: Internal Medicine

## 2011-05-15 ENCOUNTER — Other Ambulatory Visit: Payer: Self-pay | Admitting: Internal Medicine

## 2011-06-18 ENCOUNTER — Telehealth: Payer: Self-pay

## 2011-06-18 MED ORDER — ZOSTER VACCINE LIVE 19400 UNT/0.65ML ~~LOC~~ SOLR: 0.6500 mL | Freq: Once | SUBCUTANEOUS | Status: DC

## 2011-06-18 NOTE — Telephone Encounter (Signed)
Patient's husband called requesting rx for shingles to be sent to the pharmacy for him and his wife. Patient's husband not sure if they had chicken pox or not.  All above information shared with Dr.Hopper and he ok'd for patient's to get vaccine

## 2011-08-02 LAB — BASIC METABOLIC PANEL
Creatinine, Ser: 0.84
Potassium: 5.4 — ABNORMAL HIGH
Sodium: 144

## 2011-08-02 LAB — APTT: aPTT: 34

## 2011-08-02 LAB — HEMOGLOBIN AND HEMATOCRIT, BLOOD: HCT: 36.9

## 2011-10-04 ENCOUNTER — Encounter: Payer: Self-pay | Admitting: Internal Medicine

## 2011-10-29 ENCOUNTER — Telehealth: Payer: Self-pay | Admitting: Internal Medicine

## 2011-10-29 MED ORDER — LOSARTAN POTASSIUM 100 MG PO TABS: 100.0000 mg | ORAL_TABLET | Freq: Every day | ORAL | Status: DC

## 2011-10-29 MED ORDER — ATORVASTATIN CALCIUM 10 MG PO TABS: 10.0000 mg | ORAL_TABLET | Freq: Every day | ORAL | Status: DC

## 2011-10-29 NOTE — Telephone Encounter (Signed)
RX's sent 90/0refills, patient needs to schedule a CPX (last CPX 11/16/10), additional refills to be given at CPX appointment

## 2011-10-29 NOTE — Telephone Encounter (Signed)
Patient is requesting 90 day supply of losartan and lipitor be sent to [facility] pharmacy. She states that she is going out of town at the end of this month for a couple of months and does not want to run out of meds.

## 2011-10-30 NOTE — Telephone Encounter (Signed)
Left message for patient to return my call.

## 2011-10-31 NOTE — Telephone Encounter (Signed)
Left message for patient to return my call.

## 2011-11-01 NOTE — Telephone Encounter (Signed)
Left message with husband to have patient return my call

## 2011-11-02 ENCOUNTER — Telehealth: Payer: Self-pay

## 2011-11-02 MED ORDER — ATORVASTATIN CALCIUM 10 MG PO TABS: 10.0000 mg | ORAL_TABLET | Freq: Every day | ORAL | Status: DC

## 2011-11-02 MED ORDER — LOSARTAN POTASSIUM 100 MG PO TABS: 100.0000 mg | ORAL_TABLET | Freq: Every day | ORAL | Status: DC

## 2011-11-02 NOTE — Telephone Encounter (Signed)
Pt states she needed her medication sent to [facility] instead of [facility].  Rx canclled at [facility] and sent to [facility] for Lipitor and losartan.

## 2011-11-04 ENCOUNTER — Other Ambulatory Visit: Payer: Self-pay | Admitting: Internal Medicine

## 2012-01-28 ENCOUNTER — Encounter: Payer: Self-pay | Admitting: Internal Medicine

## 2012-01-28 ENCOUNTER — Ambulatory Visit (INDEPENDENT_AMBULATORY_CARE_PROVIDER_SITE_OTHER): Payer: Medicare Other | Admitting: Internal Medicine

## 2012-01-28 VITALS — BP 130/78 | HR 65 | Temp 97.8°F | Resp 12 | Ht 65.0 in | Wt 160.0 lb

## 2012-01-28 DIAGNOSIS — Z860100 Personal history of colon polyps, unspecified: Secondary | ICD-10-CM

## 2012-01-28 DIAGNOSIS — I10 Essential (primary) hypertension: Secondary | ICD-10-CM

## 2012-01-28 DIAGNOSIS — Z23 Encounter for immunization: Secondary | ICD-10-CM

## 2012-01-28 DIAGNOSIS — K5790 Diverticulosis of intestine, part unspecified, without perforation or abscess without bleeding: Secondary | ICD-10-CM | POA: Insufficient documentation

## 2012-01-28 DIAGNOSIS — E785 Hyperlipidemia, unspecified: Secondary | ICD-10-CM

## 2012-01-28 DIAGNOSIS — Z0000 Encounter for general adult medical examination without abnormal findings: Secondary | ICD-10-CM

## 2012-01-28 LAB — CBC WITH DIFFERENTIAL/PLATELET
Basophils Absolute: 0 10*3/uL (ref 0.0–0.1)
Eosinophils Absolute: 0.1 10*3/uL (ref 0.0–0.7)
HCT: 38 % (ref 36.0–46.0)
Lymphs Abs: 1.6 10*3/uL (ref 0.7–4.0)
MCHC: 33.6 g/dL (ref 30.0–36.0)
Monocytes Absolute: 0.7 10*3/uL (ref 0.1–1.0)
Monocytes Relative: 13.5 % — ABNORMAL HIGH (ref 3.0–12.0)
Platelets: 275 10*3/uL (ref 150.0–400.0)
RDW: 13.5 % (ref 11.5–14.6)

## 2012-01-28 LAB — LIPID PANEL
Cholesterol: 181 mg/dL (ref 0–200)
HDL: 61.1 mg/dL (ref >39.00)
LDL Cholesterol: 102 mg/dL — ABNORMAL HIGH (ref 0–99)
Triglycerides: 90 mg/dL (ref 0.0–149.0)
VLDL: 18 mg/dL (ref 0.0–40.0)

## 2012-01-28 LAB — BASIC METABOLIC PANEL
CO2: 26 mEq/L (ref 19–32)
Calcium: 9.2 mg/dL (ref 8.4–10.5)
Chloride: 102 mEq/L (ref 96–112)
Creatinine, Ser: 1 mg/dL (ref 0.4–1.2)
Sodium: 137 mEq/L (ref 135–145)

## 2012-01-28 LAB — HEPATIC FUNCTION PANEL
AST: 15 U/L (ref 0–37)
Total Bilirubin: 0.6 mg/dL (ref 0.3–1.2)

## 2012-01-28 NOTE — Progress Notes (Signed)
Subjective:    Patient ID: Madison Bradley, female    DOB: 1926/07/21, 78 y.o.   MRN: [id number]  HPI Medicare Wellness Visit:  The following psychosocial & medical history were reviewed as required by Medicare.   Social history: caffeine: 1-2 cups/ day , alcohol:  4 glasses wine / week ,  tobacco use : never  & exercise : walking daily X 30 min.   Home & personal  safety / fall risk: unsteady @ times, stopped playing tennis, activities of daily living: no limitations , seatbelt use : yes , and smoke alarm employment : yes .  Power of Attorney/Living Will status :unsure  Vision ( as recorded per Nurse) & Hearing  evaluation :  Ophth exam due; see exam. Orientation :oriented X3 , memory & recall :good, spelling  testing: good,and mood & affect : normal . Depression / anxiety: denied Travel history : 2003 [place] , immunization status :PNA shot today , transfusion history: no, and preventive health surveillance ( colonoscopies, BMD , etc as per protocol/ SOC): ? Colonoscopy status ; PMH of polyps, Dental care: seen every 6 months . Chart reviewed &  Updated. Active issues reviewed & addressed.       Review of Systems Influenza B infection 01/18/12 in [place] documented by nasal swab Onset/symptoms:dry cough 3/27 Exposures (illness/environmental/extrinsic):no Progression of symptoms:to more loose cough, anorexia Treatments/response:Tamiflu X 5 days with improvement Fever/chills/sweats:sweats @ night Frontal headache:no Facial pain:no Nasal purulence:no Sore throat:no Dental pain:no Lymphadenopathy:no Wheezing/shortness of breath:no Cough/sputum/hemoptysis:no sputum At this time she is asymptomatic. She did take the influenza shot in 2012.  She has no abdominal pain,unexplained weight loss, melena, or rectal bleeding. She has had colon polyps in the past; her last colonoscopy was 2009. 2 sisters had colon cancer. She was told that she did not need a colonoscopy followup at this time by Dr.  Clerance Lav practice. He has retired            Objective:   Physical Exam Gen.: Healthy and well-nourished in appearance. Alert, appropriate and cooperative throughout exam. Head: Normocephalic without obvious abnormalities  Eyes: No corneal or conjunctival inflammation noted. Ptosis bilaterally. Extraocular motion intact. Vision grossly normal. Ears: External  ear exam reveals no significant lesions or deformities. Canals ; some wax bilaterally.  Hearing is grossly normal bilaterally. Operative scar left mastoid area Nose: External nasal exam reveals no deformity or inflammation. Nasal mucosa are pink and moist. No lesions or exudates noted.  Mouth: Oral mucosa and oropharynx reveal no lesions or exudates. Teeth in good repair. Neck: No deformities, masses, or tenderness noted. Range of motion &Thyroid normal. Lungs: Normal respiratory effort; chest expands symmetrically. Lungs are clear to auscultation without rales, wheezes, or increased work of breathing. Heart: Normal rate and rhythm. Normal S1 and S2. No gallop, click, or rub. Grade 1/2 systolic murmur @ base Abdomen: Bowel sounds normal; abdomen soft and nontender. No masses, organomegaly or hernias noted. Genitalia: Gyn F/U recommended every 2 years                                       Musculoskeletal/extremities:lordosis noted of  the thoracic  spine. No clubbing, cyanosis, edema noted. Range of motion  normal .Tone & strength  Normal.OAfinger changes. Nail health  good. Vascular: Carotid, radial artery, dorsalis pedis and  posterior tibial pulses are full and equal. No bruits present. Neurologic: Alert and oriented x3. Deep  tendon reflexes symmetrical and normal.          Skin: Intact without suspicious lesions or rashes. Keratoses Lymph: No cervical, axillary lymphadenopathy present. Psych: Mood and affect are normal. Normally interactive                                                                                          Assessment & Plan:  #1 Medicare Wellness Exam; criteria met ; data entered #2 Problem List reviewed ; Assessment/ Recommendations made  #3 status post influenza B without sequela. Presentation was unusual in the absence of arthralgias and myalgias.  #4 past history of colon polyps and family history of colon cancer. It is imperative to verify recommended colonoscopic surveillance Plan: see Orders

## 2012-01-28 NOTE — Patient Instructions (Signed)
Preventive Health Care: Exercise  30-45  minutes a day, 3-4 days a week. Walking is especially valuable in preventing Osteoporosis. Eat a low-fat diet with lots of fruits and vegetables, up to 7-9 servings per day.  Health Care Power of Attorney & Living Will place you in charge of your health care  decisions. Verify these are  in place. Blood Pressure Goal  Ideally is an AVERAGE < 135/85. This AVERAGE should be calculated from @ least 5-7 BP readings taken @ different times of day on different days of week. You should not respond to isolated BP readings , but rather the AVERAGE for that week  Please try to go on My Chart within the next 24 hours to allow me to release the results directly to you.

## 2012-02-10 ENCOUNTER — Other Ambulatory Visit: Payer: Self-pay | Admitting: Internal Medicine

## 2012-02-14 ENCOUNTER — Other Ambulatory Visit: Payer: Self-pay | Admitting: Internal Medicine

## 2012-02-29 ENCOUNTER — Other Ambulatory Visit: Payer: Self-pay | Admitting: Internal Medicine

## 2012-02-29 NOTE — Telephone Encounter (Signed)
Refill done.  

## 2012-03-25 ENCOUNTER — Other Ambulatory Visit: Payer: Self-pay | Admitting: Internal Medicine

## 2012-04-04 ENCOUNTER — Encounter: Payer: Self-pay | Admitting: Internal Medicine

## 2012-05-05 ENCOUNTER — Other Ambulatory Visit: Payer: Self-pay

## 2012-05-05 NOTE — Telephone Encounter (Signed)
Message left on triage Voicemail: please contact patient back to discuss new pharmacy "[facility]" patient is in need of refills but will discuss when someone calls her back

## 2012-05-06 ENCOUNTER — Telehealth: Payer: Self-pay | Admitting: *Deleted

## 2012-05-06 MED ORDER — TRIAMTERENE-HCTZ 37.5-25 MG PO TABS: ORAL_TABLET | ORAL | Status: DC

## 2012-05-06 MED ORDER — METOPROLOL SUCCINATE ER 50 MG PO TB24: ORAL_TABLET | ORAL | Status: DC

## 2012-05-06 NOTE — Telephone Encounter (Signed)
Spoke with patient, patient requested Maxide and [facility]. RX's sent

## 2012-05-06 NOTE — Telephone Encounter (Signed)
See previous phone note.  

## 2012-05-06 NOTE — Telephone Encounter (Signed)
Pt left vm on triage line requesting that someone call her back, no explanation noted

## 2013-01-07 ENCOUNTER — Telehealth: Payer: Self-pay | Admitting: *Deleted

## 2013-01-07 MED ORDER — ATORVASTATIN CALCIUM 10 MG PO TABS: ORAL_TABLET | ORAL | Status: DC

## 2013-01-07 NOTE — Telephone Encounter (Signed)
Spoke with patient, patient informed she needs to schedule a CPX (near future). Patient was transferred to scheduling to set up appointment (pending for 02/05/13) .   Patient gave me number to pharmacy [phone] called in. #30/0 refills (lipitor)

## 2013-01-07 NOTE — Telephone Encounter (Signed)
Pt called stating that she is currently in [facility] & has ran out of her lipitor. Pt [facility] [place] [place] [facility] [address]. Pt would also like her regular 90-day supply be sent to [facility]. Please advise.

## 2013-01-08 ENCOUNTER — Other Ambulatory Visit: Payer: Self-pay | Admitting: *Deleted

## 2013-02-05 ENCOUNTER — Encounter: Payer: Self-pay | Admitting: Internal Medicine

## 2013-02-05 ENCOUNTER — Ambulatory Visit (INDEPENDENT_AMBULATORY_CARE_PROVIDER_SITE_OTHER): Payer: Medicare Other | Admitting: Internal Medicine

## 2013-02-05 VITALS — BP 132/86 | HR 68 | Temp 97.6°F | Resp 14 | Ht 64.08 in | Wt 162.0 lb

## 2013-02-05 DIAGNOSIS — E785 Hyperlipidemia, unspecified: Secondary | ICD-10-CM

## 2013-02-05 DIAGNOSIS — Z0000 Encounter for general adult medical examination without abnormal findings: Secondary | ICD-10-CM

## 2013-02-05 DIAGNOSIS — Z86010 Personal history of colonic polyps: Secondary | ICD-10-CM

## 2013-02-05 DIAGNOSIS — I10 Essential (primary) hypertension: Secondary | ICD-10-CM

## 2013-02-05 MED ORDER — ATORVASTATIN CALCIUM 10 MG PO TABS: ORAL_TABLET | ORAL | Status: DC

## 2013-02-05 MED ORDER — METOPROLOL SUCCINATE ER 50 MG PO TB24: ORAL_TABLET | ORAL | Status: DC

## 2013-02-05 MED ORDER — TRIAMTERENE-HCTZ 37.5-25 MG PO TABS: ORAL_TABLET | ORAL | Status: DC

## 2013-02-05 NOTE — Progress Notes (Signed)
Subjective:    Patient ID: Madison Bradley, female    DOB: Oct 18, 1926, 79 y.o.   MRN: [id number]  HPI Medicare Wellness Visit:  Psychosocial & medical history were reviewed as required by Medicare (abuse,antisocial behavioral risks,firearm risk).  Social history: caffeine: 1-2 cups coffee/day  , alcohol: 4 glasses/ week  ,  tobacco use: never   Exercise : walking 30-45 min daily No home & personal  safety / fall risk Activities of daily living: no limitations  Seatbelt  and smoke alarm employed. Power of Attorney/Living Will status : in place Ophthalmology exam due Hearing evaluation not current Orientation :oriented X 3  Memory & recall :good Math testing:good Mood & affect : normal . Depression / anxiety: denied but she lost her son to lymphoma Travel history : last 2004 [place]  Immunization status : Shingles /Flu/ PNA/ tetanus current Transfusion history:  none  Preventive health surveillance ( colonoscopies, BMD , mammograms,PAP as per protocol/ SOC):  colonoscopy deferred by [facility] Dental care:  Every 6 mos. Chart reviewed &  Updated. Active issues reviewed & addressed.      Review of Systems She is on a heart healthy diet; she exercises as noted weather permitting without symptoms. Specifically she denies chest pain, palpitations, dyspnea, or claudication except DOE uphill. Family history is positive for premature coronary disease in her mother.  There is medication compliance with the statin. Significant abdominal symptoms, memory deficit, or myalgias denied.  She recently had sciatica in the right lower extremity, possibly related to driving back from [place]. This did resolve without treatment. She has had some numbness in the legs upon arising on occasion. There is no significant extremity weakness or incontinence of urine or stool.  She does have nocturia frequently. .     Objective:   Physical Exam Gen.:  well-nourished in appearance. Alert,  appropriate and cooperative throughout exam. Appears younger than stated age  Head: Normocephalic without obvious abnormalities; hair very fine  Eyes: No corneal or conjunctival inflammation noted. Ptosis OS> OD. Extraocular motion intact. Vision grossly normal with lenses Ears: External  ear exam reveals no significant lesions or deformities. Wax L > R; scarring R TM. Hearing is grossly normal bilaterally. Nose: External nasal exam reveals no deformity or inflammation. Nasal mucosa are pink and moist. No lesions or exudates noted.   Mouth: Oral mucosa and oropharynx reveal no lesions or exudates. Teeth in good repair. Neck: No deformities, masses, or tenderness noted. Range of motion & Thyroid normal. Lungs: Normal respiratory effort; chest expands symmetrically. Lungs are clear to auscultation without rales, wheezes, or increased work of breathing. Heart: Normal rate and rhythm. Normal S1 and S2. No gallop, click, or rub.S4 w/o murmur. Abdomen: Bowel sounds normal; abdomen soft and nontender. No masses, organomegaly or hernias noted. Genitalia:  Gyn F/U recommended every 24 months                               Musculoskeletal/extremities: Accentuated curvature of upper thoracic  Spine.There is some asymmetry of the posterior thoracic musculature suggesting occult scoliosis. No clubbing, cyanosis,  or significant extremity edema or  deformity noted. Range of motion normal .Tone & strength  Normal. Crepitus L knee > R. Joints normal w/o   DJD DIP changes. Nail health good. Able to lie down & sit up w/o help. Negative SLR bilaterally Vascular: Carotid, radial artery, dorsalis pedis and  posterior tibial pulses are full and equal.  No bruits present. Neurologic: Alert and oriented x3. Deep tendon reflexes symmetrical and normal.        Skin: Intact without suspicious lesions or rashes. Lymph: No cervical, axillary lymphadenopathy present. Psych: Mood and affect are normal. Normally interactive                                                                                       Assessment & Plan:  #1 Medicare Wellness Exam; criteria met ; data entered #2 Problem List reviewed ; Assessment/ Recommendations made Plan: see Orders

## 2013-02-05 NOTE — Patient Instructions (Addendum)
Please  schedule fasting Labs : BMET,Lipids, hepatic panel, CBC & dif, TSH.PLEASE BRING THESE INSTRUCTIONS TO FOLLOW UP  LAB APPOINTMENT.This will guarantee correct labs are drawn, eliminating need for repeat blood sampling ( needle sticks ! ). Diagnoses /Codes: 211.3,272.4,401.9

## 2013-02-10 ENCOUNTER — Other Ambulatory Visit (INDEPENDENT_AMBULATORY_CARE_PROVIDER_SITE_OTHER): Payer: Medicare Other

## 2013-02-10 DIAGNOSIS — E785 Hyperlipidemia, unspecified: Secondary | ICD-10-CM

## 2013-02-10 DIAGNOSIS — D126 Benign neoplasm of colon, unspecified: Secondary | ICD-10-CM

## 2013-02-10 DIAGNOSIS — I10 Essential (primary) hypertension: Secondary | ICD-10-CM

## 2013-02-10 LAB — HEPATIC FUNCTION PANEL
AST: 14 U/L (ref 0–37)
Albumin: 3.6 g/dL (ref 3.5–5.2)
Alkaline Phosphatase: 60 U/L (ref 39–117)
Total Bilirubin: 0.6 mg/dL (ref 0.3–1.2)

## 2013-02-10 LAB — BASIC METABOLIC PANEL
CO2: 30 mEq/L (ref 19–32)
Calcium: 8.8 mg/dL (ref 8.4–10.5)
Chloride: 102 mEq/L (ref 96–112)
Creatinine, Ser: 0.8 mg/dL (ref 0.4–1.2)
Glucose, Bld: 74 mg/dL (ref 70–99)

## 2013-02-10 LAB — CBC WITH DIFFERENTIAL/PLATELET
Eosinophils Relative: 3.7 % (ref 0.0–5.0)
HCT: 36.5 % (ref 36.0–46.0)
Hemoglobin: 12.2 g/dL (ref 12.0–15.0)
Lymphs Abs: 1.4 10*3/uL (ref 0.7–4.0)
MCV: 86.4 fl (ref 78.0–100.0)
Monocytes Absolute: 0.5 10*3/uL (ref 0.1–1.0)
Monocytes Relative: 14.9 % — ABNORMAL HIGH (ref 3.0–12.0)
Neutro Abs: 1.5 10*3/uL (ref 1.4–7.7)
Platelets: 238 10*3/uL (ref 150.0–400.0)
RDW: 14.1 % (ref 11.5–14.6)
WBC: 3.7 10*3/uL — ABNORMAL LOW (ref 4.5–10.5)

## 2013-02-10 LAB — LIPID PANEL
LDL Cholesterol: 105 mg/dL — ABNORMAL HIGH (ref 0–99)
Total CHOL/HDL Ratio: 3

## 2013-02-10 LAB — TSH: TSH: 2.17 u[IU]/mL (ref 0.35–5.50)

## 2013-02-19 DEATH — deceased

## 2013-03-06 ENCOUNTER — Other Ambulatory Visit: Payer: Self-pay | Admitting: Internal Medicine

## 2013-04-21 ENCOUNTER — Encounter: Payer: Self-pay | Admitting: Internal Medicine

## 2013-11-01 ENCOUNTER — Other Ambulatory Visit: Payer: Self-pay

## 2013-11-01 ENCOUNTER — Emergency Department (HOSPITAL_COMMUNITY): Payer: Medicare Other

## 2013-11-01 ENCOUNTER — Emergency Department (HOSPITAL_COMMUNITY)
Admission: EM | Admit: 2013-11-01 | Discharge: 2013-11-01 | Disposition: A | Discharge status: Home / Self Care | Payer: Medicare Other | Attending: Emergency Medicine | Admitting: Emergency Medicine

## 2013-11-01 DIAGNOSIS — Z860100 Personal history of colon polyps, unspecified: Secondary | ICD-10-CM | POA: Insufficient documentation

## 2013-11-01 DIAGNOSIS — E785 Hyperlipidemia, unspecified: Secondary | ICD-10-CM | POA: Insufficient documentation

## 2013-11-01 DIAGNOSIS — I10 Essential (primary) hypertension: Secondary | ICD-10-CM | POA: Insufficient documentation

## 2013-11-01 DIAGNOSIS — Z7982 Long term (current) use of aspirin: Secondary | ICD-10-CM | POA: Insufficient documentation

## 2013-11-01 DIAGNOSIS — H538 Other visual disturbances: Secondary | ICD-10-CM | POA: Insufficient documentation

## 2013-11-01 DIAGNOSIS — Z79899 Other long term (current) drug therapy: Secondary | ICD-10-CM | POA: Insufficient documentation

## 2013-11-01 DIAGNOSIS — Z8719 Personal history of other diseases of the digestive system: Secondary | ICD-10-CM | POA: Insufficient documentation

## 2013-11-01 DIAGNOSIS — R42 Dizziness and giddiness: Secondary | ICD-10-CM | POA: Insufficient documentation

## 2013-11-01 DIAGNOSIS — R911 Solitary pulmonary nodule: Secondary | ICD-10-CM | POA: Insufficient documentation

## 2013-11-01 DIAGNOSIS — R55 Syncope and collapse: Secondary | ICD-10-CM | POA: Insufficient documentation

## 2013-11-01 LAB — CBC WITH DIFFERENTIAL/PLATELET
BASOS ABS: 0 10*3/uL (ref 0.0–0.1)
Basophils Relative: 1 % (ref 0–1)
EOS PCT: 3 % (ref 0–5)
Eosinophils Absolute: 0.1 10*3/uL (ref 0.0–0.7)
HCT: 39.4 % (ref 36.0–46.0)
Hemoglobin: 13.3 g/dL (ref 12.0–15.0)
LYMPHS PCT: 34 % (ref 12–46)
Lymphs Abs: 1.4 10*3/uL (ref 0.7–4.0)
MCH: 29.5 pg (ref 26.0–34.0)
MCHC: 33.8 g/dL (ref 30.0–36.0)
MCV: 87.4 fL (ref 78.0–100.0)
Monocytes Absolute: 0.8 10*3/uL (ref 0.1–1.0)
Monocytes Relative: 19 % — ABNORMAL HIGH (ref 3–12)
NEUTROS ABS: 1.8 10*3/uL (ref 1.7–7.7)
Neutrophils Relative %: 44 % (ref 43–77)
PLATELETS: 246 10*3/uL (ref 150–400)
RBC: 4.51 MIL/uL (ref 3.87–5.11)
RDW: 13.7 % (ref 11.5–15.5)
WBC: 4.1 10*3/uL (ref 4.0–10.5)

## 2013-11-01 LAB — COMPREHENSIVE METABOLIC PANEL
ALBUMIN: 3.9 g/dL (ref 3.5–5.2)
ALT: 15 U/L (ref 0–35)
AST: 18 U/L (ref 0–37)
Alkaline Phosphatase: 72 U/L (ref 39–117)
BUN: 24 mg/dL — ABNORMAL HIGH (ref 6–23)
CALCIUM: 9.2 mg/dL (ref 8.4–10.5)
CHLORIDE: 100 meq/L (ref 96–112)
CO2: 26 meq/L (ref 19–32)
CREATININE: 1.02 mg/dL (ref 0.50–1.10)
GFR calc Af Amer: 56 mL/min — ABNORMAL LOW (ref >90)
GFR calc non Af Amer: 49 mL/min — ABNORMAL LOW (ref >90)
Glucose, Bld: 96 mg/dL (ref 70–99)
Potassium: 4.4 mEq/L (ref 3.7–5.3)
SODIUM: 137 meq/L (ref 137–147)
Total Bilirubin: 0.4 mg/dL (ref 0.3–1.2)
Total Protein: 6.9 g/dL (ref 6.0–8.3)

## 2013-11-01 LAB — GLUCOSE, CAPILLARY: GLUCOSE-CAPILLARY: 72 mg/dL (ref 70–99)

## 2013-11-01 LAB — POCT I-STAT TROPONIN I: TROPONIN I, POC: 0 ng/mL (ref 0.00–0.08)

## 2013-11-01 LAB — URINALYSIS, ROUTINE W REFLEX MICROSCOPIC
Bilirubin Urine: NEGATIVE
GLUCOSE, UA: NEGATIVE mg/dL
HGB URINE DIPSTICK: NEGATIVE
Ketones, ur: 15 mg/dL — AB
Nitrite: NEGATIVE
PH: 6.5 (ref 5.0–8.0)
Protein, ur: NEGATIVE mg/dL
SPECIFIC GRAVITY, URINE: 1.015 (ref 1.005–1.030)
Urobilinogen, UA: 0.2 mg/dL (ref 0.0–1.0)

## 2013-11-01 LAB — URINE MICROSCOPIC-ADD ON

## 2013-11-01 NOTE — ED Provider Notes (Signed)
CSN: [id number]     Arrival date & time 11/01/13  1019 History   First MD Initiated Contact with Patient 11/01/13 1127     Chief Complaint  Patient presents with  . Weakness   (Consider location/radiation/quality/duration/timing/severity/associated sxs/prior Treatment) HPI  Madison Bradley Is an 80 year old female with a past medical history of hypertension, hyperlipidemia, previous syncopal episode who presents the emergency department for syncopal episode today.  Patient states she was at church standing for approximate 5 minutes during the mass when she began to have double vision.  Patient states she felt dizzy.  Her husband states that he collapsed.  She was able to come to quickly.  There were 2 physicians present a few attended her and recommended she come to the emergency department.  Patient had no confusion.  She states that she does not remember passing out.  She did not remember any racing or skipping in her heart previous to the syncope.  She has not had any history of anemia.  She denies any hematochezia or melena.  Denies fevers, chills, myalgias, arthralgias. Denies DOE, SOB, chest tightness or pressure, radiation to left arm, jaw or back, or diaphoresis. Denies dysuria, flank pain, suprapubic pain, frequency, urgency, or hematuria. Denies unilateral weakness, facial asymmetry, difficulty with speech, change in gait, or vertigo. Denies abdominal pain, nausea, vomiting, diarrhea or constipation.    Past Medical History  Diagnosis Date  . Hyperlipidemia   . Hypertension   . Colon polyps 2009  . Family hx of colon cancer   . IBS (irritable bowel syndrome)    Past Surgical History  Procedure Laterality Date  . Colonoscopy with polypectomy      Dr Sammuel Cooper , last 2009  . Cataract extraction, bilateral      SE Ophth  . Carpal tunnel release    . Mastoidectomy      L @ 77 mos old  . Tonsillectomy and adenoidectomy    . Knee arthroscopy     Family History  Problem  Relation Age of Onset  . Stroke Mother 59    cerebral hemorrhage   . Heart attack Mother     in her late 39s  . Colon cancer Sister     2 sisters  . Heart attack Sister     in late 52s  . Cancer Daughter     Hodgkins Lymphoma  . Cancer Son     NHL, Mantle cell  . Diabetes Neg Hx   . Stomach cancer Sister    History  Substance Use Topics  . Smoking status: Never Smoker   . Smokeless tobacco: Not on file  . Alcohol Use: Yes     Comment: Wine , < 4 glasses / week   OB History   Grav Para Term Preterm Abortions TAB SAB Ect Mult Living                 Review of Systems Ten systems reviewed and are negative for acute change, except as noted in the HPI.   Allergies  Review of patient's allergies indicates no known allergies.  Home Medications   Current Outpatient Rx  Name  Route  Sig  Dispense  Refill  . aspirin 81 MG tablet   Oral   Take 81 mg by mouth. 2 by mouth every evening         . atorvastatin (LIPITOR) 10 MG tablet      TAKE 1 TABLET DAILY   90 tablet   3   .  losartan (COZAAR) 100 MG tablet      TAKE 1 TABLET DAILY   90 tablet   3   . metoprolol succinate (TOPROL-XL) 50 MG 24 hr tablet      Take with or immediately following a meal.TAKE 1 TAB DAILY   90 tablet   3   . triamterene-hydrochlorothiazide (MAXZIDE-25) 37.5-25 MG per tablet      TAKE 1/2 TABLET ONCE DAILY   45 tablet   3    BP 178/93  Pulse 66  Temp(Src) 97.8 F (36.6 C) (Oral)  Resp 18  SpO2 100% Physical Exam Physical Exam  Nursing note and vitals reviewed. Constitutional: She is oriented to person, place, and time. She appears well-developed and well-nourished. No distress.  HENT:  Head: Normocephalic and atraumatic.  Eyes: Conjunctivae normal and EOM are normal. Pupils are equal, round, and reactive to light. No scleral icterus.  no nystagmus Neck: Normal range of motion.  no carotid bruits. Cardiovascular: Normal rate, regular rhythm and normal heart sounds.  Exam  reveals no gallop and no friction rub.   No murmur heard. Pulmonary/Chest: Effort normal and breath sounds normal. No respiratory distress.  Abdominal: Soft. Bowel sounds are normal. She exhibits no distension and no mass. There is no tenderness. There is no guarding.  Neurological: She is alert and oriented to person, place, and time.  Speech is clear and goal oriented, follows commands Major Cranial nerves without deficit, no facial droop Normal strength in upper and lower extremities bilaterally including dorsiflexion and plantar flexion, strong and equal grip strength Sensation normal to light and sharp touch Moves extremities without ataxia, coordination intact Normal finger to nose and rapid alternating movements Skin: Skin is warm and dry. She is not diaphoretic.    ED Course  Procedures (including critical care time) Labs Review Labs Reviewed  CBC WITH DIFFERENTIAL - Abnormal; Notable for the following:    Monocytes Relative 19 (*)    All other components within normal limits  GLUCOSE, CAPILLARY  COMPREHENSIVE METABOLIC PANEL  URINALYSIS, ROUTINE W REFLEX MICROSCOPIC   Imaging Review No results found.  EKG Interpretation    Date/Time:  Sunday November 01 2013 10:25:39 EST Ventricular Rate:  60 PR Interval:  178 QRS Duration: 80 QT Interval:  438 QTC Calculation: 438 R Axis:   39 Text Interpretation:  Normal sinus rhythm RSR' or QR pattern in V1 suggests right ventricular conduction delay Borderline ECG ED PHYSICIAN INTERPRETATION AVAILABLE IN [facility] Confirmed by TEST, RECORD ([id number]) on 11/03/2013 9:31:23 AM            MDM   1. Syncope and collapse   2. Incidental lung nodule    80 year old female with episode of syncope today at church.  Previous episode and evaluation here or 5 years ago.  She was discharged that day.  She had negative workup including EKG, chest x-ray, all normal labs and head CT.  Repeat the workup today appear   3:14 PM Filed  Vitals:   11/01/13 1132 11/01/13 1318 11/01/13 1400 11/01/13 1500  BP: 178/93 157/60 157/68 132/100  Pulse: 66 65 58 65  Temp: 97.8 F (36.6 C)     TempSrc: Oral     Resp: 18 14 16 25   SpO2: 100% 100% 100% 97%    She appears well. She does have EKG changes and meets high risk category per [place] syncope criteria. The patient does not want admission although she has been offered this. She also had a pulmonary nodule.  Dr. Curly Rim and I spent 20 min discussing follow up, going over labs, EKG and imaging. Questions answered to best of our ability Patient appears safe for discharge and follow up with PCP and cardiology.   Margarita Mail, PA-C 11/05/13 1008

## 2013-11-01 NOTE — ED Notes (Signed)
Dr. Curly Rim in room now answering some additional questions.

## 2013-11-01 NOTE — ED Notes (Signed)
Pt alert and oriented x 3.

## 2013-11-01 NOTE — ED Notes (Signed)
Pt. Stated, I was sitting in church and all of a sudden i felt weak all over and mostly in my head, and I started to perspire and then this friend Dr. Council Mechanic over and told me to come here. I feel fine now.

## 2013-11-01 NOTE — Discharge Instructions (Signed)
Your work up was negative for any acute abnormality. Your chest xray did show a small 2 cm nodule in the right lower lobe. I am providing you with a reading of the xray. You will need to follow up with your primary care doctor for an outpatient chest CT. This would not account for your episode of passing out today. Please read the information below regarding your diagnosis. Follow up closely with your PCP and cardiology. You are advised to avoid driving for 6 months after an episode of syncope or until cleared by your doctor.  Syncope Syncope is a fainting spell. This means the person loses consciousness and drops to the ground. The person is generally unconscious for less than 5 minutes. The person may have some muscle twitches for up to 15 seconds before waking up and returning to normal. Syncope occurs more often in elderly people, but it can happen to anyone. While most causes of syncope are not dangerous, syncope can be a sign of a serious medical problem. It is important to seek medical care.  CAUSES  Syncope is caused by a sudden decrease in blood flow to the brain. The specific cause is often not determined. Factors that can trigger syncope include:  Taking medicines that lower blood pressure.  Sudden changes in posture, such as standing up suddenly.  Taking more medicine than prescribed.  Standing in one place for too long.  Seizure disorders.  Dehydration and excessive exposure to heat.  Low blood sugar (hypoglycemia).  Straining to have a bowel movement.  Heart disease, irregular heartbeat, or other circulatory problems.  Fear, emotional distress, seeing blood, or severe pain. SYMPTOMS  Right before fainting, you may:  Feel dizzy or lightheaded.  Feel nauseous.  See all white or all black in your field of vision.  Have cold, clammy skin. DIAGNOSIS  Your caregiver will ask about your symptoms, perform a physical exam, and perform electrocardiography (ECG) to record  the electrical activity of your heart. Your caregiver may also perform other heart or blood tests to determine the cause of your syncope. TREATMENT  In most cases, no treatment is needed. Depending on the cause of your syncope, your caregiver may recommend changing or stopping some of your medicines. HOME CARE INSTRUCTIONS  Have someone stay with you until you feel stable.  Do not drive, operate machinery, or play sports until your caregiver says it is okay.  Keep all follow-up appointments as directed by your caregiver.  Lie down right away if you start feeling like you might faint. Breathe deeply and steadily. Wait until all the symptoms have passed.  Drink enough fluids to keep your urine clear or pale yellow.  If you are taking blood pressure or heart medicine, get up slowly, taking several minutes to sit and then stand. This can reduce dizziness. SEEK IMMEDIATE MEDICAL CARE IF:   You have a severe headache.  You have unusual pain in the chest, abdomen, or back.  You are bleeding from the mouth or rectum, or you have black or tarry stool.  You have an irregular or very fast heartbeat.  You have pain with breathing.  You have repeated fainting or seizure-like jerking during an episode.  You faint when sitting or lying down.  You have confusion.  You have difficulty walking.  You have severe weakness.  You have vision problems. If you fainted, call your local emergency services (911 in [place] Do not drive yourself to the hospital.  MAKE SURE YOU:  Understand  these instructions.  Will watch your condition.  Will get help right away if you are not doing well or get worse. Document Released: 10/08/2005 Document Revised: 04/08/2012 Document Reviewed: 12/07/2011 [facility] Patient Information 2014 [place].  Pulmonary Nodule A pulmonary nodule is a small, round growth of tissue in the lung. Pulmonary nodules can range in size from less than 1/5 inch (4 mm) to a  little bigger than an inch (25 mm). Most pulmonary nodules are detected when imaging tests of the lung are being performed for a different problem. Pulmonary nodules are usually not cancerous (benign). However, some pulmonary nodules are cancerous (malignant). Follow-up treatment or testing is based on the size of the pulmonary nodule and your risk of getting lung cancer.  CAUSES Benign pulmonary nodules can be caused by various things. Some of the causes include:   Bacterial, fungal, or viral infections. This is usually an old infection that is no longer active, but it can sometimes be a current, active infection.  A benign mass of tissue.  Inflammation from conditions such as rheumatoid arthritis.   Abnormal blood vessels in the lungs. Malignant pulmonary nodules can result from lung cancer or from cancers that spread to the lung from other places in the body. SIGNS AND SYMPTOMS Pulmonary nodules usually do not cause symptoms. DIAGNOSIS Most often, pulmonary nodules are found incidentally when an X-ray or CT scan is performed to look for some other problem in the lung area. To help determine whether a pulmonary nodule is benign or malignant, your health care provider will take a medical history and order a variety of tests. Tests done may include:   Blood tests.  A skin test called a tuberculin test. This test is used to determine if you have been exposed to the germ that causes tuberculosis.   Chest X-rays. If possible, a new X-ray may be compared with X-rays you have had in the past.   CT scan. This test shows smaller pulmonary nodules more clearly than an X-ray.   Positron emission tomography (PET) scan. In this test, a safe amount of a radioactive substance is injected into the bloodstream. Then, the scan takes a picture of the pulmonary nodule. The radioactive substance is eliminated from your body in your urine.   Biopsy. A tiny piece of the pulmonary nodule is removed so it  can be checked under a microscope. TREATMENT  Pulmonary nodules that are benign normally do not require any treatment because they usually do not cause symptoms or breathing problems. Your health care provider may want to monitor the pulmonary nodule through follow-up CT scans. The frequency of these CT scans will vary based on the size of the nodule and the risk factors for lung cancer. For example, CT scans will need to be done more frequently if the pulmonary nodule is larger and if you have a history of smoking and a family history of cancer. Further testing or biopsies may be done if any follow-up CT scan shows that the size of the pulmonary nodule has increased. HOME CARE INSTRUCTIONS  Only take over-the-counter or prescription medicines as directed by your health care provider.  Keep all follow-up appointments with your health care provider. SEEK MEDICAL CARE IF:  You have trouble breathing when you are active.   You feel sick or unusually tired.   You do not feel like eating.   You lose weight without trying to.   You develop chills or night sweats.  SEEK IMMEDIATE MEDICAL CARE IF:  You cannot catch your breath, or you begin wheezing.   You cannot stop coughing.   You cough up blood.   You become dizzy or feel like you are going to pass out.   You have sudden chest pain.   You have a fever or persistent symptoms for more than 2 3 days.   You have a fever and your symptoms suddenly get worse. MAKE SURE YOU:  Understand these instructions.  Will watch your condition.  Will get help right away if you are not doing well or get worse. Document Released: 08/05/2009 Document Revised: 06/10/2013 Document Reviewed: 03/30/2013 [facility] Patient Information 2014 [place].  Driving and Equipment Restrictions Some medical problems make it dangerous to drive, ride a bike, or use machines. Some of these problems are:  A hard blow to the head  (concussion).  Passing out (fainting).  Twitching and shaking (seizures).  Low blood sugar.  Taking medicine to help you relax (sedatives).  Taking pain medicines.  Wearing an eye patch.  Wearing splints. This can make it hard to use parts of your body that you need to drive safely. HOME CARE   Do not drive until your doctor says it is okay.  Do not use machines until your doctor says it is okay. You may need a form signed by your doctor (medical release) before you can drive again. You may also need this form before you do other tasks where you need to be fully alert. MAKE SURE YOU:  Understand these instructions.  Will watch your condition.  Will get help right away if you are not doing well or get worse. Document Released: 11/15/2004 Document Revised: 12/31/2011 Document Reviewed: 02/15/2010 [facility] Patient Information 2014 [place].  Holter Monitoring A Holter monitor is a small device with electrodes (small sticky patches) that attach to your chest. It records the electrical activity of your heart and is worn continuously for 24-48 hours.  A HOLTER MONITOR IS USED TO  Detect heart problems such as:  Heart arrhythmia. Is an abnormal or irregular heartbeat. With some heart arrhythmias, you may not feel or know that you have an irregular heart rhythm.  Palpitations, such as feeling your heart racing or fluttering. It is possible to have heart palpitations and not have a heart arrhythmia.  A heart rhythm that is too slow or too fast.  If you have problems fainting, near fainting or feeling light-headed, a Holter monitor may be worn to see if your heart is the cause. HOLTER MONITOR PREPARATION   Electrodes will be attached to the skin on your chest.  If you have hair on your chest, small areas may have to be shaved. This is done to help the patches stick better and make the recording more accurate.  The electrodes are attached by wires to the Holter monitor.  The Holter monitor clips to your clothing. You will wear the monitor at all times, even while exercising and sleeping. HOME CARE INSTRUCTIONS   Wear your monitor at all times.  The wires and the monitor must stay dry. Do not get the monitor wet.  Do not bathe, swim or use a hot tub with it on.  You may do a "sponge" bath while you have the monitor on.  Keep your skin clean, do not put body lotion or moisturizer on your chest.  It's possible that your skin under the electrodes could become irritated. To keep this from happening, you may put the electrodes in slightly different places on  your chest.  Your caregiver will also ask you to keep a diary of your activities, such as walking or doing chores. Be sure to note what you are doing if you experience heart symptoms such as palpitations. This will help your caregiver determine what might be contributing to your symptoms. The information stored in your monitor will be reviewed by your caregiver alongside your diary entries.  Make sure the monitor is safely clipped to your clothing or in a location close to your body that your caregiver recommends.  The monitor and electrodes are removed when the test is over. Return the monitor as directed.  Be sure to follow up with your caregiver and discuss your Holter monitor results. SEEK IMMEDIATE MEDICAL CARE IF:  You faint or feel lightheaded.  You have trouble breathing.  You get pain in your chest, upper arm or jaw.  You feel sick to your stomach and your skin is pale, cool, or damp.  You think something is wrong with the way your heart is beating. MAKE SURE YOU:   Understand these instructions.  Will watch your condition.  Will get help right away if you are not doing well or get worse. Document Released: 07/06/2004 Document Revised: 12/31/2011 Document Reviewed: 11/18/2008 [facility] Patient Information 2014 [place], [place].

## 2013-11-01 NOTE — ED Provider Notes (Signed)
Medical screening examination/treatment/procedure(s) were conducted as a shared visit with non-physician practitioner(s) and myself.  I personally evaluated the patient during the encounter.   Date: 11/01/2013 @ 1025  Rate: 60  Rhythm: normal sinus rhythm  QRS Axis: normal  Intervals: normal  ST/T Wave abnormalities: normal  Conduction Disutrbances:Incomplete RBBB; RSR pattern in V1-V3  Narrative Interpretation:   Old EKG Reviewed: changes noted, RSR in V2 and V3 are new  Lengthy d/w pt regarding her symptoms and w/u.  Pt stays up very late at night and has irregular sleeping patterns which may have contributed to her symptoms.  She declines admission, but agrees to f/u with PCP and cardiology.  ER precautions given.      Elmer Sow, MD 11/01/13 [phone]

## 2013-11-05 NOTE — ED Provider Notes (Signed)
Medical screening examination/treatment/procedure(s) were conducted as a shared visit with non-physician practitioner(s) and myself.  I personally evaluated the patient during the encounter.  EKG Interpretation    Date/Time:  Sunday November 01 2013 10:25:39 EST Ventricular Rate:  60 PR Interval:  178 QRS Duration: 80 QT Interval:  438 QTC Calculation: 438 R Axis:   39 Text Interpretation:  Normal sinus rhythm RSR' or QR pattern in V1 suggests right ventricular conduction delay Borderline ECG ED PHYSICIAN INTERPRETATION AVAILABLE IN [facility] Confirmed by TEST, RECORD ([id number]) on 11/03/2013 9:31:23 AM            Syncope at church. ER w/u neg.  Pt offered admission, but adamantly declined.  See discussion with the patient and her family. All are aware of her need followup and strict ER return precautions were given.  Elmer Sow, MD 11/05/13 [phone]

## 2013-11-09 ENCOUNTER — Encounter: Payer: Self-pay | Admitting: Internal Medicine

## 2013-11-09 ENCOUNTER — Ambulatory Visit (INDEPENDENT_AMBULATORY_CARE_PROVIDER_SITE_OTHER): Payer: Medicare Other | Admitting: Internal Medicine

## 2013-11-09 VITALS — BP 186/75 | HR 70 | Temp 97.8°F | Wt 165.2 lb

## 2013-11-09 DIAGNOSIS — R911 Solitary pulmonary nodule: Secondary | ICD-10-CM

## 2013-11-09 DIAGNOSIS — R55 Syncope and collapse: Secondary | ICD-10-CM

## 2013-11-09 NOTE — Progress Notes (Signed)
Pre visit review using our clinic review tool, if applicable. No additional management support is needed unless otherwise documented below in the visit note. 

## 2013-11-09 NOTE — Assessment & Plan Note (Signed)
ECHO & carotid Doppler

## 2013-11-09 NOTE — Progress Notes (Signed)
   Subjective:    Patient ID: Madison Bradley, female    DOB: [birth year], 80 y.o.   MRN: [id number]  HPI   The emergency room records 11/01/13 were reviewed. She had an episode of syncope lasting approximately 2 minutes after standing for approximately 5 minutes. She does describe some "funny feeling" in her head prior to the event but no other cardiac or neurologic prodrome.  There was no associated seizure stigmata.  Labs revealed 19% monocytes, BUN 24, and GFR 49.  Incidental finding was a 2 cm right lower lobe nodule. She has never smoked  She has a history of syncope and 2010 related to dehydration.      Review of Systems  She specifically denies headaches, limb numbness, tingling, or weakness prior to event  She had no change in her heart rhythm or rate. She denied chest pain or shortness of breath.  Present expresses concern that she spends long periods on her portable computer.  She does describe some tingling in her legs upon arising at times.  Blood pressure not monitored.  Compliant with anti hypertemsive medication but not taken today. No lightheadedness or other adverse medication effect described.  Significant headaches, epistaxis, chest pain, palpitations, exertional dyspnea, claudication, paroxysmal nocturnal dyspnea, or edema absent.     Objective:   Physical Exam  Appears healthy and well-nourished & in no acute distress.Appears younger than stated age  No carotid bruits are present.No neck pain distention present at 10 - 15 degrees. Thyroid normal to palpation  Heart rhythm and rate are normal with Grade 1/6 systolic murmur.  Chest is clear with no increased work of breathing  There is no evidence of aortic aneurysm or renal artery bruits  Abdomen soft with no organomegaly or masses. No HJR  No clubbing, cyanosis or edema present.  Pedal pulses are intact   No ischemic skin changes are present . Nails healthy   Alert and oriented. Strength,  tone, DTRs reflexes normal          Assessment & Plan:  See Current Assessment & Plan in Problem List under specific Diagnosis

## 2013-11-09 NOTE — Assessment & Plan Note (Signed)
CT scan

## 2013-11-09 NOTE — Patient Instructions (Addendum)
Your next office appointment will be determined based upon review of your pending labs & studies. Those instructions will be transmitted to you through My Chart.  Please report any significant change in your symptoms. Minimal Blood Pressure Goal= AVERAGE < 140/90;  Ideal is an AVERAGE < 135/85. This AVERAGE should be calculated from @ least 5-7 BP readings taken @ different times of day on different days of week. You should not respond to isolated BP readings , but rather the AVERAGE for that week .Please bring your  blood pressure cuff to office visits to verify that it is reliable.It  can also be checked against the blood pressure device at the pharmacy. Finger or wrist cuffs are not dependable; an arm cuff is.

## 2013-11-10 ENCOUNTER — Other Ambulatory Visit (HOSPITAL_COMMUNITY): Payer: Self-pay | Admitting: Cardiology

## 2013-11-10 ENCOUNTER — Other Ambulatory Visit: Payer: Self-pay | Admitting: *Deleted

## 2013-11-10 DIAGNOSIS — R55 Syncope and collapse: Secondary | ICD-10-CM

## 2013-11-10 DIAGNOSIS — R911 Solitary pulmonary nodule: Secondary | ICD-10-CM

## 2013-11-11 ENCOUNTER — Encounter: Payer: Self-pay | Admitting: *Deleted

## 2013-11-11 ENCOUNTER — Ambulatory Visit (INDEPENDENT_AMBULATORY_CARE_PROVIDER_SITE_OTHER): Payer: Medicare Other | Admitting: Interventional Cardiology

## 2013-11-11 ENCOUNTER — Encounter (INDEPENDENT_AMBULATORY_CARE_PROVIDER_SITE_OTHER): Payer: Medicare Other

## 2013-11-11 ENCOUNTER — Ambulatory Visit (HOSPITAL_COMMUNITY): Payer: Medicare Other | Attending: Cardiology

## 2013-11-11 ENCOUNTER — Encounter: Payer: Self-pay | Admitting: Interventional Cardiology

## 2013-11-11 ENCOUNTER — Encounter: Payer: Self-pay | Admitting: Cardiology

## 2013-11-11 VITALS — BP 137/73 | HR 75 | Ht 66.5 in | Wt 165.0 lb

## 2013-11-11 DIAGNOSIS — R55 Syncope and collapse: Secondary | ICD-10-CM

## 2013-11-11 DIAGNOSIS — I10 Essential (primary) hypertension: Secondary | ICD-10-CM

## 2013-11-11 DIAGNOSIS — R911 Solitary pulmonary nodule: Secondary | ICD-10-CM

## 2013-11-11 DIAGNOSIS — I6529 Occlusion and stenosis of unspecified carotid artery: Secondary | ICD-10-CM | POA: Insufficient documentation

## 2013-11-11 DIAGNOSIS — I658 Occlusion and stenosis of other precerebral arteries: Secondary | ICD-10-CM | POA: Insufficient documentation

## 2013-11-11 DIAGNOSIS — E785 Hyperlipidemia, unspecified: Secondary | ICD-10-CM | POA: Insufficient documentation

## 2013-11-11 NOTE — Progress Notes (Signed)
Patient ID: Madison Bradley, female   DOB: 08/06/1926, 80 y.o.   MRN: [id number] E-Cardio 48 hour holter monitor applied to patient.

## 2013-11-11 NOTE — Patient Instructions (Signed)
Your physician recommends that you continue on your current medications as directed. Please refer to the Current Medication list given to you today.  Your physician has recommended that you wear a holter monitor. Holter monitors are medical devices that record the heart's electrical activity. Doctors most often use these monitors to diagnose arrhythmias. Arrhythmias are problems with the speed or rhythm of the heartbeat. The monitor is a small, portable device. You can wear one while you do your normal daily activities. This is usually used to diagnose what is causing palpitations/syncope (passing out).   Your physician recommends that you schedule a follow-up appointment pending results of monitor,carotid doppler, and echo

## 2013-11-11 NOTE — Progress Notes (Signed)
Patient ID: Madison Bradley, female   DOB: 10/11/1926, 80 y.o.   MRN: [id number]   Date: 11/11/2013 ID: Madison Bradley, Madison Bradley 1925/11/08, MRN [id number] PCP: Unice Cobble, MD  Reason: Syncope  ASSESSMENT;  1. Syncope, likely neurally mediated/vasovagal 2. Hypertension 3. Incomplete right bundle branch block 4. Solitary lung nodule identified coincidentally on chest x-ray  PLAN:  1. 48 hour Holter monitor 2. Echocardiogram and carotid Doppler already ordered by Dr. Linna Darner 3. Discussed prodrome and response which will include immedia it resolved quickly. It resolved quickly the episodes resolved without residual symptomstthe episode resolvede sitting and or pulling to the side of the road if she has presyncopal symptoms. No limitations/restrictions are imposed to  SUBJECTIVE: Madison Bradley is a 80 y.o. female who is referred for evaluation of syncope. There is a history of 2 syncopal episodes. The first occurred 4 years ago. It occurred while she was showering. There was no prodrome. No explanation was found. She did not injure herself with the episode. The most recent episode occurred 10 days ago at church. She stood and shortly after standing began feeling lightheaded. This was followed by a syncopal episode. The episode resolved quickly and there have been no subsequent complaints. Since the episode she has had no complaints. There's been no episodic or orthostatic dizziness.   No Known Allergies  Current Outpatient Prescriptions on File Prior to Visit  Medication Sig Dispense Refill  . aspirin 81 MG tablet Take 81 mg by mouth. 2 by mouth every evening      . atorvastatin (LIPITOR) 10 MG tablet TAKE 1 TABLET DAILY  90 tablet  3  . losartan (COZAAR) 100 MG tablet TAKE 1 TABLET DAILY  90 tablet  3  . metoprolol succinate (TOPROL-XL) 50 MG 24 hr tablet Take with or immediately following a meal.TAKE 1 TAB DAILY  90 tablet  3  . triamterene-hydrochlorothiazide (MAXZIDE-25)  37.5-25 MG per tablet TAKE 1/2 TABLET ONCE DAILY  45 tablet  3   No current facility-administered medications on file prior to visit.    Past Medical History  Diagnosis Date  . Hyperlipidemia   . Hypertension   . Colon polyps 2009  . Family hx of colon cancer   . IBS (irritable bowel syndrome)     Past Surgical History  Procedure Laterality Date  . Colonoscopy with polypectomy      Dr Sammuel Cooper , last 2009  . Cataract extraction, bilateral      SE Ophth  . Carpal tunnel release    . Mastoidectomy      L @ 52 mos old  . Tonsillectomy and adenoidectomy    . Knee arthroscopy      History   Social History  . Marital Status: Married    Spouse Name: N/A    Number of Children: N/A  . Years of Education: N/A   Occupational History  . Not on file.   Social History Main Topics  . Smoking status: Never Smoker   . Smokeless tobacco: Not on file  . Alcohol Use: Yes     Comment: Wine , < 4 glasses / week  . Drug Use: No  . Sexual Activity: Not on file   Other Topics Concern  . Not on file   Social History Narrative  . No narrative on file    Family History  Problem Relation Age of Onset  . Stroke Mother 57    cerebral hemorrhage   . Heart attack Mother  in her late 49s  . Colon cancer Sister     2 sisters  . Heart attack Sister     in late 81s  . Cancer Daughter     Hodgkins Lymphoma  . Cancer Son     NHL, Mantle cell  . Diabetes Neg Hx   . Stomach cancer Sister     ROS: Denies palpitations, chest pain, orthopnea, edema, transient neurological symptoms, seizure activity, head trauma, headache, vision disturbance, nausea, vomiting, diarrhea, and excessive thirst.. Other systems negative for complaints.  OBJECTIVE: BP 137/73  Pulse 75  Ht 5' 6.5" (1.689 m)  Wt 165 lb (74.844 kg)  BMI 26.24 kg/m2,  General: No acute distress, appearing her stated age of 75 HEENT: normal without pallor or jaundice Neck: JVD flat. Carotids 2+, symmetric, without  bruits Chest: Clear Cardiac: Murmur: 1 of 6 g the left midsternal border systolic murmur. Gallop: Absent. Rhythm: Stable. Other: Normal Abdomen: Bruit: Absent. Pulsation: Absent Extremities: Edema: Absent. Pulses: 2+ and symmetric in upper and lower extremities Neuro: Normal Psych: Anxious  ECG: Recent EKGs reviewed with sinus rhythm and incomplete right bundle noted

## 2013-11-13 ENCOUNTER — Ambulatory Visit (HOSPITAL_COMMUNITY)
Admission: RE | Admit: 2013-11-13 | Discharge: 2013-11-13 | Disposition: A | Discharge status: Home / Self Care | Payer: Medicare Other | Source: Ambulatory Visit | Attending: Family Medicine | Admitting: Family Medicine

## 2013-11-13 ENCOUNTER — Ambulatory Visit (INDEPENDENT_AMBULATORY_CARE_PROVIDER_SITE_OTHER)
Admission: RE | Admit: 2013-11-13 | Discharge: 2013-11-13 | Disposition: A | Discharge status: Home / Self Care | Payer: Medicare Other | Source: Ambulatory Visit | Attending: Internal Medicine | Admitting: Internal Medicine

## 2013-11-13 DIAGNOSIS — R55 Syncope and collapse: Secondary | ICD-10-CM | POA: Insufficient documentation

## 2013-11-13 DIAGNOSIS — I517 Cardiomegaly: Secondary | ICD-10-CM

## 2013-11-13 DIAGNOSIS — E785 Hyperlipidemia, unspecified: Secondary | ICD-10-CM | POA: Insufficient documentation

## 2013-11-13 DIAGNOSIS — R911 Solitary pulmonary nodule: Secondary | ICD-10-CM

## 2013-11-13 DIAGNOSIS — I10 Essential (primary) hypertension: Secondary | ICD-10-CM | POA: Insufficient documentation

## 2013-11-13 DIAGNOSIS — Z8249 Family history of ischemic heart disease and other diseases of the circulatory system: Secondary | ICD-10-CM | POA: Insufficient documentation

## 2013-11-13 NOTE — Progress Notes (Signed)
  Echocardiogram 2D Echocardiogram has been performed.  Diamond Nickel 11/13/2013, 3:57 PM

## 2013-11-17 ENCOUNTER — Encounter: Payer: Self-pay | Admitting: Internal Medicine

## 2013-11-18 ENCOUNTER — Telehealth: Payer: Self-pay | Admitting: Interventional Cardiology

## 2013-11-18 ENCOUNTER — Telehealth: Payer: Self-pay | Admitting: *Deleted

## 2013-11-18 NOTE — Telephone Encounter (Signed)
New Problem:  Pt states she has questions about her heart monitor and would like a call back.

## 2013-11-18 NOTE — Telephone Encounter (Signed)
Copy of Dr. Clayborn Heron note sent to the patient via My Chart

## 2013-11-18 NOTE — Telephone Encounter (Signed)
Line busy@10 :20am - PE

## 2013-11-18 NOTE — Telephone Encounter (Signed)
Patient has questions regarding her recent monitor results. She would like to speak with Dr. Tamala Julian prior to Friday, as she is leaving for [place] and is concerned regarding her results and her upcoming trip. Her contact number is: [phone].

## 2013-11-19 NOTE — Telephone Encounter (Signed)
pt given results of holter monitor.Brief svt. No Afib.Normal study for pt age.pt verbalized understanding.

## 2013-12-20 ENCOUNTER — Other Ambulatory Visit: Payer: Self-pay | Admitting: Internal Medicine

## 2013-12-24 ENCOUNTER — Other Ambulatory Visit: Payer: Self-pay | Admitting: Internal Medicine

## 2014-01-14 ENCOUNTER — Other Ambulatory Visit: Payer: Self-pay | Admitting: Internal Medicine

## 2014-03-01 ENCOUNTER — Other Ambulatory Visit: Payer: Self-pay

## 2014-03-01 MED ORDER — LOSARTAN POTASSIUM 100 MG PO TABS: ORAL_TABLET | ORAL | Status: DC

## 2014-03-01 NOTE — Telephone Encounter (Signed)
Pt request 90 day Rx for losartan. She has been notified that this has been sent.

## 2014-03-13 IMAGING — CT CT HEAD W/O CM
1 series · 16 of 28 positions shown, 20 images · non-contrast
Comparison: 11/20/2008

CLINICAL DATA: Severe headache

EXAM:
CT HEAD WITHOUT CONTRAST
TECHNIQUE: Contiguous axial images were obtained from the base of the skull
through the vertex without contrast.

[Series 2: head 5.0 h30s · axial · 0.44mm/px · z∈[+582,+707]mm · 16 of 28 slices shown, 20 images]
[im 2/28  brain]
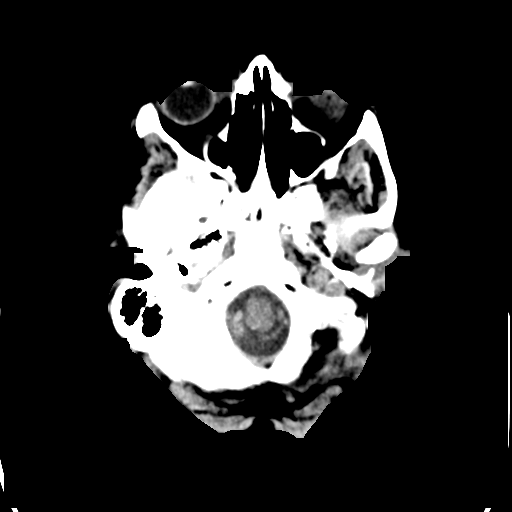
[im 2/28  bone]
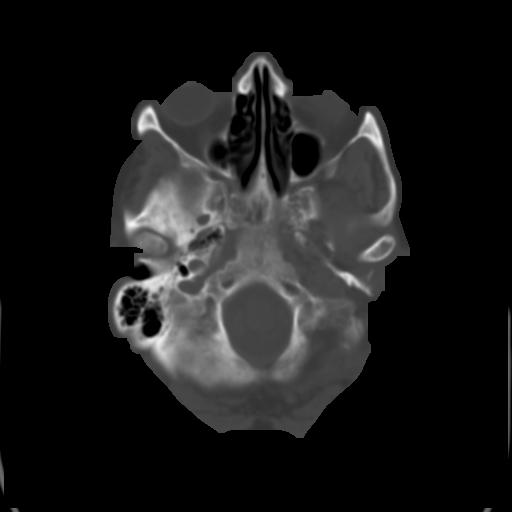
[im 4/28  brain]
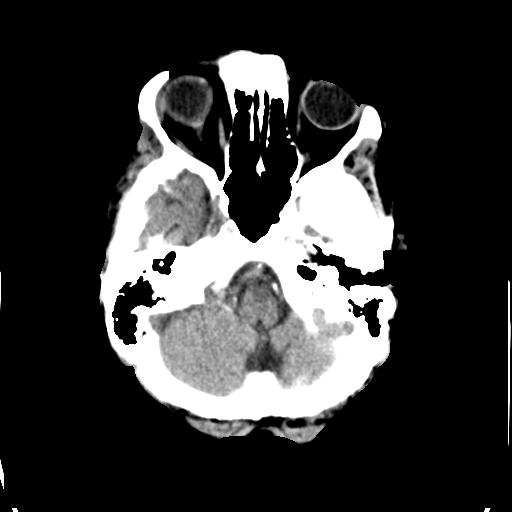
[im 6/28  brain]
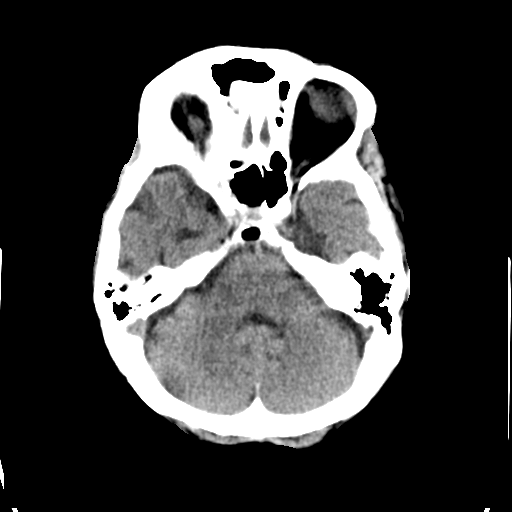
[im 7/28  brain]
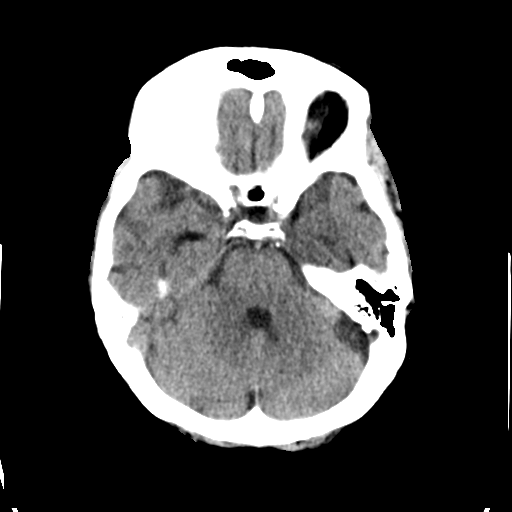
[im 9/28  brain]
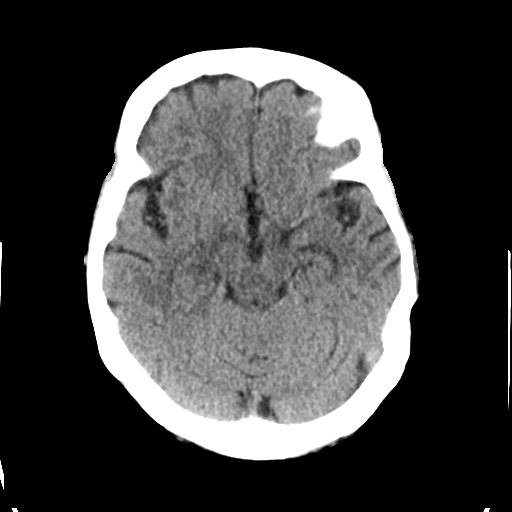
[im 9/28  bone]
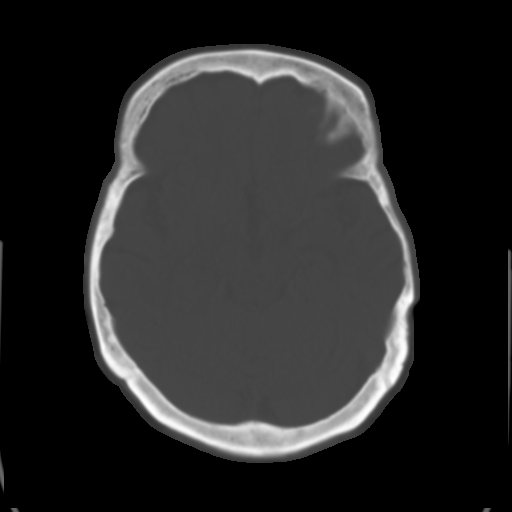
[im 10/28  brain]
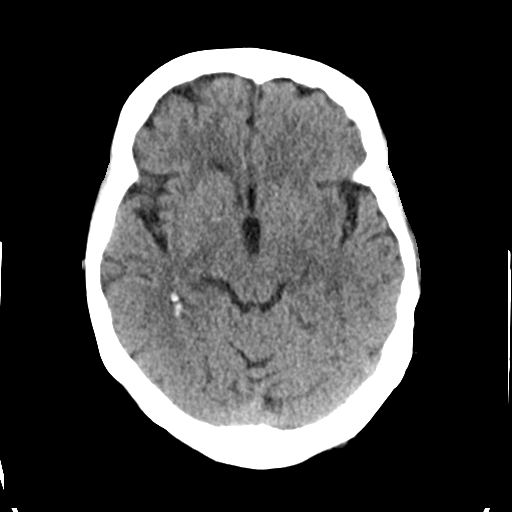
[im 12/28  brain]
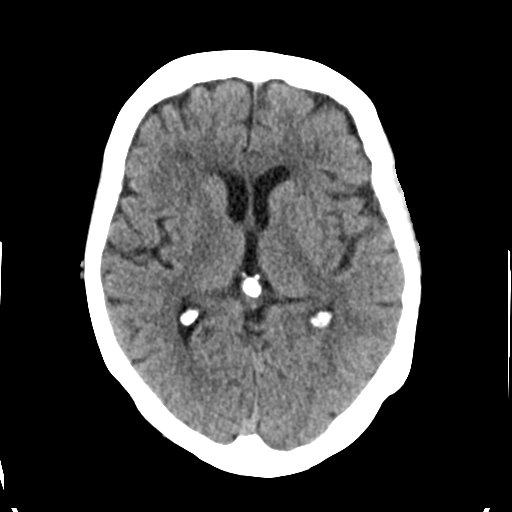
[im 14/28  brain]
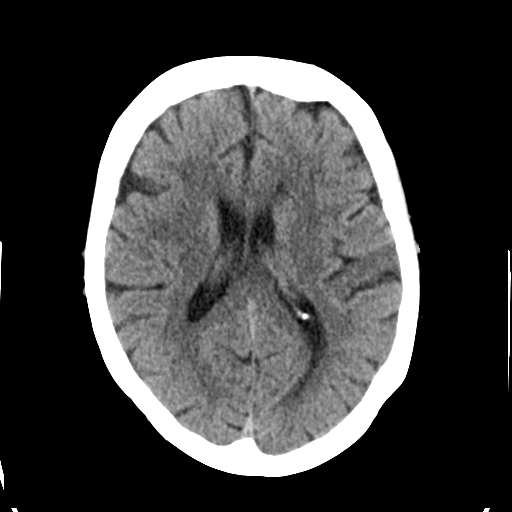
[im 15/28  brain]
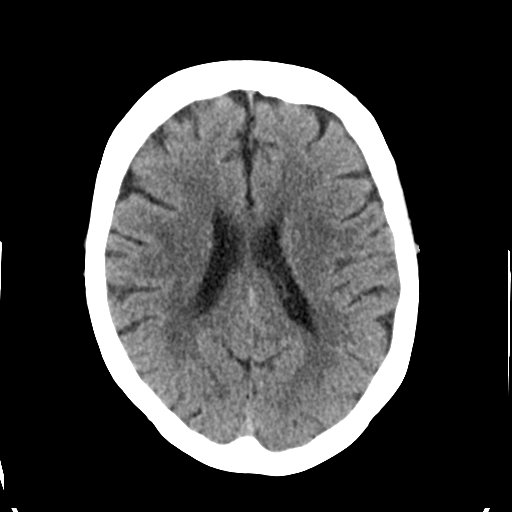
[im 15/28  bone]
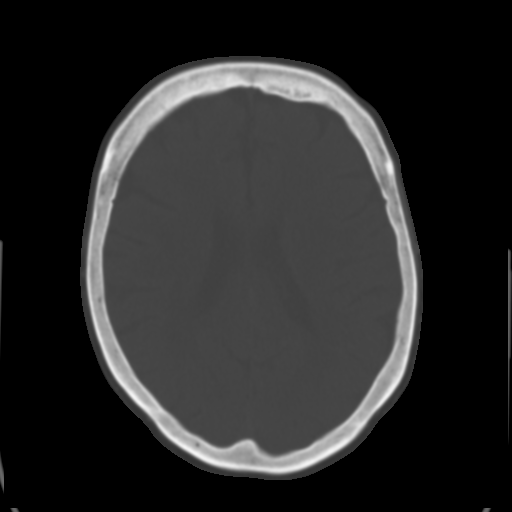
[im 17/28  brain]
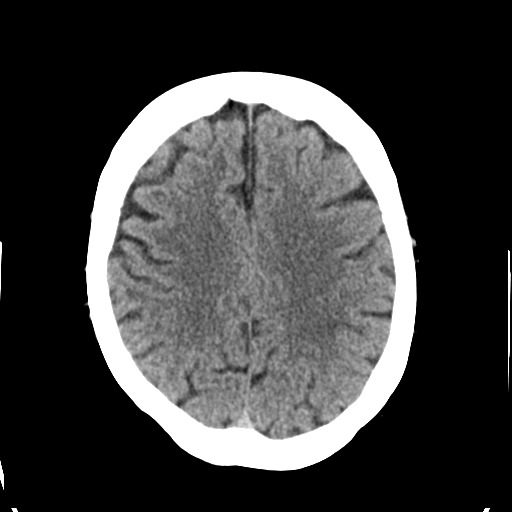
[im 19/28  brain]
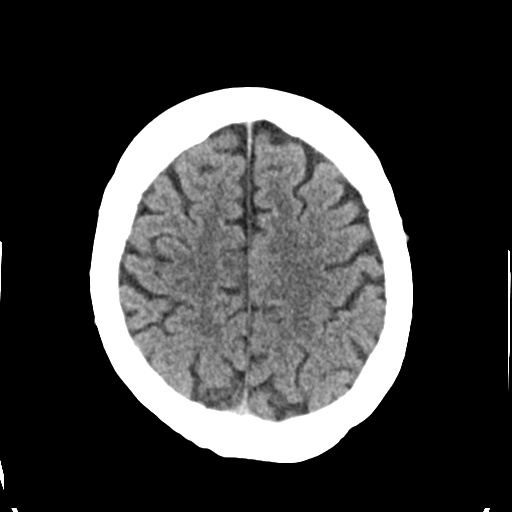
[im 20/28  brain]
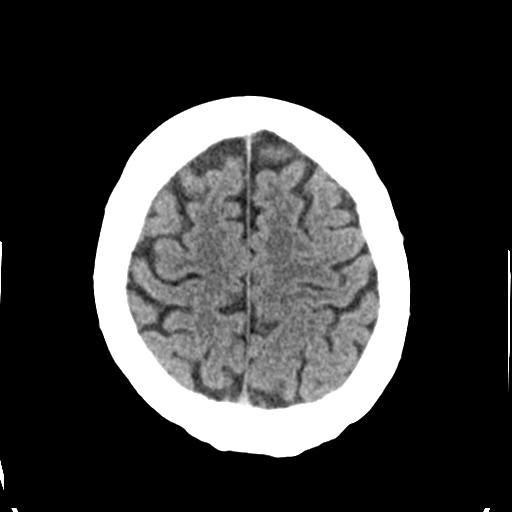
[im 22/28  brain]
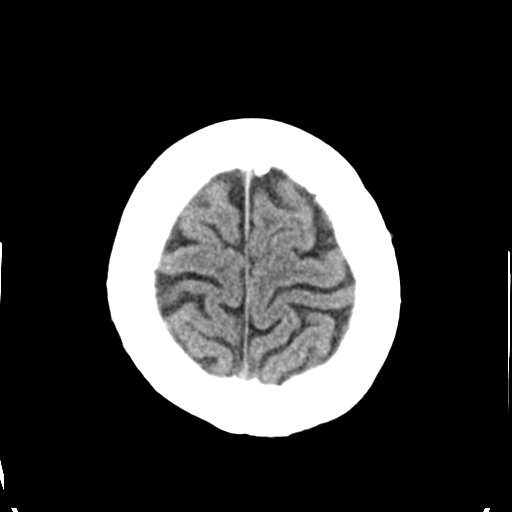
[im 22/28  bone]
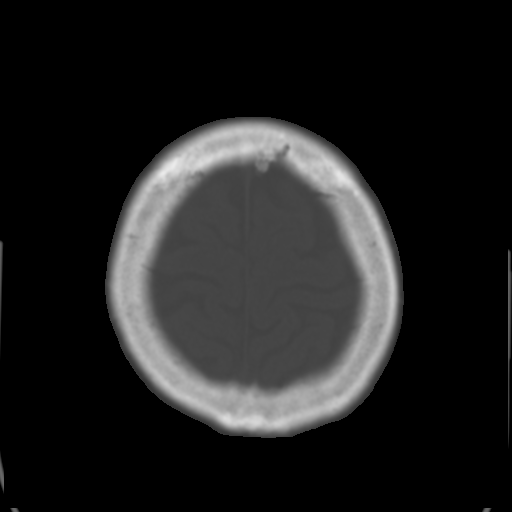
[im 23/28  brain]
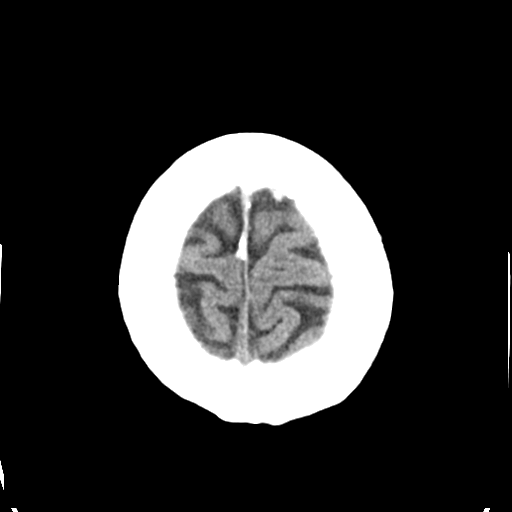
[im 25/28  brain]
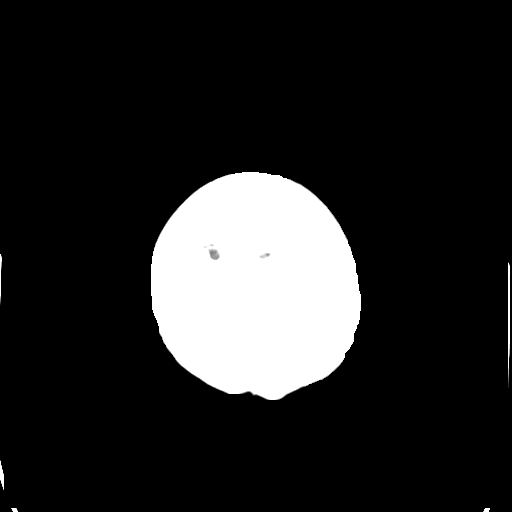
[im 27/28  brain]
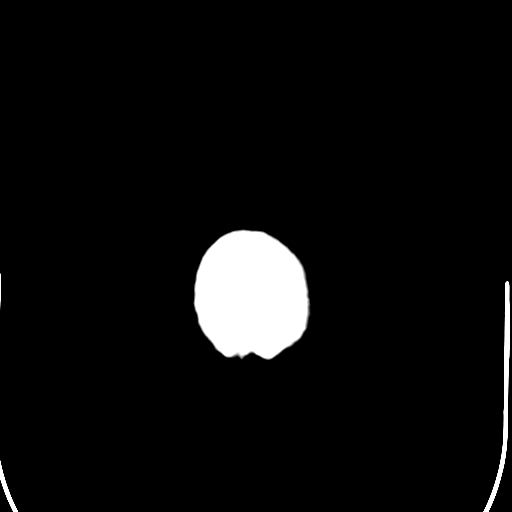

[16 of 28 positions shown; findings below may reference images not displayed]

FINDINGS: Age related brain atrophy. Mild chronic white matter microvascular
ischemic change diffusely. No acute intracranial hemorrhage, mass
lesion, definite infarction, midline shift, herniation,
hydrocephalus, or extra-axial fluid collection. Normal gray-white
matter differentiation. Cisterns patent. No cerebellar abnormality.
Mastoids and sinuses clear.
IMPRESSION: Stable atrophy.  No acute finding or interval change.

## 2014-10-04 ENCOUNTER — Other Ambulatory Visit: Payer: Self-pay | Admitting: Internal Medicine

## 2014-10-25 ENCOUNTER — Other Ambulatory Visit (HOSPITAL_COMMUNITY): Payer: Self-pay | Admitting: Cardiology

## 2014-10-25 DIAGNOSIS — I6523 Occlusion and stenosis of bilateral carotid arteries: Secondary | ICD-10-CM

## 2014-10-27 ENCOUNTER — Ambulatory Visit (HOSPITAL_COMMUNITY): Payer: Medicare Other | Attending: Cardiovascular Disease | Admitting: Cardiology

## 2014-10-27 DIAGNOSIS — I6523 Occlusion and stenosis of bilateral carotid arteries: Secondary | ICD-10-CM | POA: Diagnosis not present

## 2014-10-27 NOTE — Progress Notes (Signed)
Carotid duplex performed 

## 2014-11-18 ENCOUNTER — Other Ambulatory Visit: Payer: Self-pay | Admitting: Internal Medicine

## 2014-12-07 ENCOUNTER — Encounter: Payer: Self-pay | Admitting: Internal Medicine

## 2015-01-02 ENCOUNTER — Other Ambulatory Visit: Payer: Self-pay | Admitting: Internal Medicine

## 2015-01-06 ENCOUNTER — Other Ambulatory Visit: Payer: Self-pay | Admitting: Internal Medicine

## 2015-02-02 ENCOUNTER — Other Ambulatory Visit: Payer: Self-pay

## 2015-02-02 MED ORDER — LOSARTAN POTASSIUM 100 MG PO TABS: ORAL_TABLET | ORAL | Status: DC

## 2015-02-14 ENCOUNTER — Other Ambulatory Visit (INDEPENDENT_AMBULATORY_CARE_PROVIDER_SITE_OTHER): Payer: Medicare Other

## 2015-02-14 ENCOUNTER — Encounter: Payer: Self-pay | Admitting: Internal Medicine

## 2015-02-14 ENCOUNTER — Ambulatory Visit (INDEPENDENT_AMBULATORY_CARE_PROVIDER_SITE_OTHER): Payer: Medicare Other | Admitting: Internal Medicine

## 2015-02-14 ENCOUNTER — Other Ambulatory Visit: Payer: Self-pay | Admitting: Internal Medicine

## 2015-02-14 VITALS — BP 130/80 | HR 62 | Temp 98.3°F | Resp 16 | Ht 67.0 in | Wt 162.5 lb

## 2015-02-14 DIAGNOSIS — Z86010 Personal history of colonic polyps: Secondary | ICD-10-CM

## 2015-02-14 DIAGNOSIS — I10 Essential (primary) hypertension: Secondary | ICD-10-CM

## 2015-02-14 DIAGNOSIS — E785 Hyperlipidemia, unspecified: Secondary | ICD-10-CM

## 2015-02-14 LAB — CBC WITH DIFFERENTIAL/PLATELET
BASOS PCT: 1.1 % (ref 0.0–3.0)
Basophils Absolute: 0.1 10*3/uL (ref 0.0–0.1)
EOS PCT: 2.7 % (ref 0.0–5.0)
Eosinophils Absolute: 0.2 10*3/uL (ref 0.0–0.7)
HEMATOCRIT: 38.2 % (ref 36.0–46.0)
Hemoglobin: 13.1 g/dL (ref 12.0–15.0)
LYMPHS ABS: 1.9 10*3/uL (ref 0.7–4.0)
LYMPHS PCT: 33.6 % (ref 12.0–46.0)
MCHC: 34.3 g/dL (ref 30.0–36.0)
MCV: 84.6 fl (ref 78.0–100.0)
MONOS PCT: 16.1 % — AB (ref 3.0–12.0)
Monocytes Absolute: 0.9 10*3/uL (ref 0.1–1.0)
NEUTROS ABS: 2.6 10*3/uL (ref 1.4–7.7)
NEUTROS PCT: 46.5 % (ref 43.0–77.0)
Platelets: 248 10*3/uL (ref 150.0–400.0)
RBC: 4.51 Mil/uL (ref 3.87–5.11)
RDW: 14 % (ref 11.5–15.5)
WBC: 5.6 10*3/uL (ref 4.0–10.5)

## 2015-02-14 LAB — BASIC METABOLIC PANEL
BUN: 26 mg/dL — AB (ref 6–23)
CO2: 28 mEq/L (ref 19–32)
Calcium: 9.2 mg/dL (ref 8.4–10.5)
Chloride: 101 mEq/L (ref 96–112)
Creatinine, Ser: 0.97 mg/dL (ref 0.40–1.20)
GFR: 57.74 mL/min — AB (ref >60.00)
Glucose, Bld: 81 mg/dL (ref 70–99)
POTASSIUM: 4.8 meq/L (ref 3.5–5.1)
Sodium: 133 mEq/L — ABNORMAL LOW (ref 135–145)

## 2015-02-14 LAB — HEPATIC FUNCTION PANEL
ALK PHOS: 67 U/L (ref 39–117)
ALT: 11 U/L (ref 0–35)
AST: 15 U/L (ref 0–37)
Albumin: 4.1 g/dL (ref 3.5–5.2)
BILIRUBIN DIRECT: 0.1 mg/dL (ref 0.0–0.3)
Total Bilirubin: 0.5 mg/dL (ref 0.2–1.2)
Total Protein: 6.9 g/dL (ref 6.0–8.3)

## 2015-02-14 LAB — TSH: TSH: 2.3 u[IU]/mL (ref 0.35–4.50)

## 2015-02-14 MED ORDER — ATORVASTATIN CALCIUM 10 MG PO TABS: 10.0000 mg | ORAL_TABLET | Freq: Every day | ORAL | Status: DC

## 2015-02-14 MED ORDER — LOSARTAN POTASSIUM 100 MG PO TABS: ORAL_TABLET | ORAL | Status: DC

## 2015-02-14 NOTE — Progress Notes (Signed)
   Subjective:    Patient ID: Jeanella Flattery, female    DOB: [birth year], 81 y.o.   MRN: [id number]  HPI The patient is here to assess status of active health conditions.  PMH, FH, & Social History reviewed & updated.  She is on a heart healthy low-salt diet. She walks 30 minutes on average 6 days a week without cardio pulmonary symptoms. She does not monitor blood pressure on a regular basis as she finds it stressful to do so.  She's been out of her statin for 2 days; otherwise she has been compliant with her medications without adverse effects. Her lipids have not been checked since April 2014.  Her last colon surveillance was in 2009. Other than rare hemorrhoidal bleeding related to large stools, she has no GI symptoms. Two sisters had colon cancer.    Review of Systems  Significant headaches, epistaxis, chest pain, palpitations, exertional dyspnea, claudication, paroxysmal nocturnal dyspnea, or edema absent. No GI symptoms , memory loss or myalgias   Unexplained weight loss, abdominal pain, significant dyspepsia, dysphagia, melena, rectal bleeding, or persistently small caliber stools are denied.     Objective:   Physical Exam Pertinent or positive findings include: She has wax in both ears, greater on the right than the left. Bilateral ptosis is present.  Heart sounds are markedly distant.  There is accentuation of the upper thoracic spine curvature. She has mixed DIP/PIP arthritic changes.  She has fusiform changes of the knees with severe crepitus.   General appearance :adequately nourished; in no distress.Appears younger than stated age Eyes: No conjunctival inflammation or scleral icterus is present. Oral exam:  Lips and gums are healthy appearing.There is no oropharyngeal erythema or exudate noted. Dental hygiene is good. Heart:  Normal rate and regular rhythm. S1 and S2 normal without gallop, murmur, click, rub or other extra sounds  Lungs:Chest clear to auscultation;  no wheezes, rhonchi,rales ,or rubs present.No increased work of breathing.  Abdomen: bowel sounds normal, soft and non-tender without masses, organomegaly or hernias noted.  No guarding or rebound.  Vascular : all pulses equal ; no bruits present. Skin:Warm & dry.  Intact without suspicious lesions or rashes ; no tenting or jaundice  Lymphatic: No lymphadenopathy is noted about the head, neck, axilla Neuro: Strength, tone & DTRs normal.          Assessment & Plan:  See Current Assessment & Plan in Problem List under specific Diagnosis

## 2015-02-14 NOTE — Assessment & Plan Note (Signed)
CBC

## 2015-02-14 NOTE — Assessment & Plan Note (Signed)
Blood pressure goals reviewed. BMET 

## 2015-02-14 NOTE — Progress Notes (Signed)
Pre visit review using our clinic review tool, if applicable. No additional management support is needed unless otherwise documented below in the visit note. 

## 2015-02-14 NOTE — Assessment & Plan Note (Addendum)
NMR Lipoprofile, LFTs, TSH ,CK

## 2015-02-14 NOTE — Patient Instructions (Signed)
Minimal Blood Pressure Goal= AVERAGE < 140/90;  Ideal is an AVERAGE < 135/85. This AVERAGE should be calculated from @ least 5-7 BP readings taken @ different times of day on different days of week. You should not respond to isolated BP readings , but rather the AVERAGE for that week .Please bring your  blood pressure cuff to office visits to verify that it is reliable.It  can also be checked against the blood pressure device at the pharmacy. Finger or wrist cuffs are not dependable; an arm cuff is. Your next office appointment will be determined based upon review of your pending labs .  Those instructions will be transmitted to you by mail for your records.  Critical results will be called.   Followup as needed for any active or acute issue. Please report any significant change in your symptoms.

## 2015-02-17 LAB — NMR LIPOPROFILE WITH LIPIDS
Cholesterol, Total: 195 mg/dL (ref 100–199)
HDL Particle Number: 30.3 umol/L — ABNORMAL LOW (ref >=30.5)
HDL SIZE: 10.1 nm (ref >=9.2)
HDL-C: 76 mg/dL (ref >39)
LARGE HDL: 12.8 umol/L (ref >=4.8)
LDL CALC: 100 mg/dL — AB (ref 0–99)
LDL Particle Number: 1303 nmol/L — ABNORMAL HIGH (ref <1000)
LDL SIZE: 21.4 nm (ref >=20.8)
LP-IR Score: <25 (ref <=45)
Large VLDL-P: 1.3 nmol/L (ref <=2.7)
SMALL LDL PARTICLE NUMBER: 289 nmol/L (ref <=527)
Triglycerides: 96 mg/dL (ref 0–149)
VLDL Size: 40.6 nm (ref <=46.6)

## 2015-10-29 ENCOUNTER — Other Ambulatory Visit: Payer: Self-pay | Admitting: Internal Medicine

## 2015-11-07 LAB — HM MAMMOGRAPHY

## 2015-11-14 ENCOUNTER — Encounter: Payer: Self-pay | Admitting: Internal Medicine

## 2015-11-15 ENCOUNTER — Encounter: Payer: Self-pay | Admitting: Internal Medicine

## 2015-12-29 ENCOUNTER — Other Ambulatory Visit: Payer: Self-pay | Admitting: Internal Medicine

## 2016-01-28 ENCOUNTER — Other Ambulatory Visit: Payer: Self-pay | Admitting: Internal Medicine

## 2016-01-30 NOTE — Telephone Encounter (Signed)
Patient states her husband, Madison Bradley is seeing dr Jenny Reichmann and would also like for dr Jenny Reichmann to be her new pcp---former dr hopper patient---routing to dr Jenny Reichmann to request permission for this patient to start seeing dr Jenny Reichmann as new pcp=---please advise, i will call patient back and honor refill request, thanks

## 2016-01-31 MED ORDER — TRIAMTERENE-HCTZ 37.5-25 MG PO TABS: 0.5000 | ORAL_TABLET | Freq: Every day | ORAL | Status: DC

## 2016-01-31 NOTE — Telephone Encounter (Signed)
One month done to local pharmacy only, per refill policy, pt last seen April 2016  Pt needs new PCP

## 2016-02-13 MED ORDER — METOPROLOL SUCCINATE ER 50 MG PO TB24: 50.0000 mg | ORAL_TABLET | Freq: Every day | ORAL | Status: DC

## 2016-02-13 NOTE — Telephone Encounter (Signed)
Pt call requesting refill on her metoprolol inform pt before refill can be sent she must get set-up w/new PCP. She states her husband transferred to Dr. Jenny Reichmann, and would like appt w/him. Made appt for 02/24/16 inform can only send 30 day to local pharmacy until she see MD. Durene Cal  to [facility].../lmb

## 2016-02-24 ENCOUNTER — Ambulatory Visit: Payer: Medicare Other | Admitting: Internal Medicine

## 2016-03-01 ENCOUNTER — Encounter: Payer: Self-pay | Admitting: Internal Medicine

## 2016-03-01 ENCOUNTER — Other Ambulatory Visit (INDEPENDENT_AMBULATORY_CARE_PROVIDER_SITE_OTHER): Payer: Medicare Other

## 2016-03-01 ENCOUNTER — Other Ambulatory Visit: Payer: Self-pay | Admitting: Internal Medicine

## 2016-03-01 ENCOUNTER — Ambulatory Visit (INDEPENDENT_AMBULATORY_CARE_PROVIDER_SITE_OTHER): Payer: Medicare Other | Admitting: Internal Medicine

## 2016-03-01 VITALS — BP 132/82 | HR 79 | Temp 97.9°F | Resp 20 | Wt 161.0 lb

## 2016-03-01 DIAGNOSIS — Z0001 Encounter for general adult medical examination with abnormal findings: Secondary | ICD-10-CM

## 2016-03-01 DIAGNOSIS — L309 Dermatitis, unspecified: Secondary | ICD-10-CM | POA: Insufficient documentation

## 2016-03-01 DIAGNOSIS — E785 Hyperlipidemia, unspecified: Secondary | ICD-10-CM

## 2016-03-01 DIAGNOSIS — I872 Venous insufficiency (chronic) (peripheral): Secondary | ICD-10-CM | POA: Diagnosis not present

## 2016-03-01 DIAGNOSIS — R6889 Other general symptoms and signs: Secondary | ICD-10-CM

## 2016-03-01 DIAGNOSIS — Z0000 Encounter for general adult medical examination without abnormal findings: Secondary | ICD-10-CM | POA: Insufficient documentation

## 2016-03-01 DIAGNOSIS — I10 Essential (primary) hypertension: Secondary | ICD-10-CM | POA: Diagnosis not present

## 2016-03-01 DIAGNOSIS — Z800 Family history of malignant neoplasm of digestive organs: Secondary | ICD-10-CM | POA: Insufficient documentation

## 2016-03-01 LAB — URINALYSIS, ROUTINE W REFLEX MICROSCOPIC
Bilirubin Urine: NEGATIVE
Hgb urine dipstick: NEGATIVE
KETONES UR: NEGATIVE
Nitrite: NEGATIVE
PH: 7 (ref 5.0–8.0)
SPECIFIC GRAVITY, URINE: 1.01 (ref 1.000–1.030)
Total Protein, Urine: NEGATIVE
Urine Glucose: NEGATIVE
Urobilinogen, UA: 0.2 (ref 0.0–1.0)

## 2016-03-01 LAB — LIPID PANEL
CHOL/HDL RATIO: 5
Cholesterol: 279 mg/dL — ABNORMAL HIGH (ref 0–200)
HDL: 52.2 mg/dL (ref >39.00)
LDL Cholesterol: 200 mg/dL — ABNORMAL HIGH (ref 0–99)
NONHDL: 226.43
Triglycerides: 134 mg/dL (ref 0.0–149.0)
VLDL: 26.8 mg/dL (ref 0.0–40.0)

## 2016-03-01 LAB — TSH: TSH: 2.13 u[IU]/mL (ref 0.35–4.50)

## 2016-03-01 LAB — CBC WITH DIFFERENTIAL/PLATELET
BASOS ABS: 0 10*3/uL (ref 0.0–0.1)
Basophils Relative: 0.7 % (ref 0.0–3.0)
Eosinophils Absolute: 0.1 10*3/uL (ref 0.0–0.7)
Eosinophils Relative: 1.6 % (ref 0.0–5.0)
HEMATOCRIT: 40.3 % (ref 36.0–46.0)
HEMOGLOBIN: 13.6 g/dL (ref 12.0–15.0)
LYMPHS PCT: 29.7 % (ref 12.0–46.0)
Lymphs Abs: 1.8 10*3/uL (ref 0.7–4.0)
MCHC: 33.6 g/dL (ref 30.0–36.0)
MCV: 84.4 fl (ref 78.0–100.0)
MONOS PCT: 14.2 % — AB (ref 3.0–12.0)
Monocytes Absolute: 0.9 10*3/uL (ref 0.1–1.0)
NEUTROS ABS: 3.2 10*3/uL (ref 1.4–7.7)
Neutrophils Relative %: 53.8 % (ref 43.0–77.0)
PLATELETS: 269 10*3/uL (ref 150.0–400.0)
RBC: 4.78 Mil/uL (ref 3.87–5.11)
RDW: 13.9 % (ref 11.5–15.5)
WBC: 6 10*3/uL (ref 4.0–10.5)

## 2016-03-01 LAB — BASIC METABOLIC PANEL
BUN: 28 mg/dL — ABNORMAL HIGH (ref 6–23)
CALCIUM: 9.2 mg/dL (ref 8.4–10.5)
CO2: 27 meq/L (ref 19–32)
CREATININE: 1.09 mg/dL (ref 0.40–1.20)
Chloride: 103 mEq/L (ref 96–112)
GFR: 50.35 mL/min — ABNORMAL LOW (ref >60.00)
Glucose, Bld: 89 mg/dL (ref 70–99)
Potassium: 5.5 mEq/L — ABNORMAL HIGH (ref 3.5–5.1)
Sodium: 137 mEq/L (ref 135–145)

## 2016-03-01 LAB — HEPATIC FUNCTION PANEL
ALT: 9 U/L (ref 0–35)
AST: 11 U/L (ref 0–37)
Albumin: 4.1 g/dL (ref 3.5–5.2)
Alkaline Phosphatase: 57 U/L (ref 39–117)
BILIRUBIN DIRECT: 0.1 mg/dL (ref 0.0–0.3)
TOTAL PROTEIN: 6.9 g/dL (ref 6.0–8.3)
Total Bilirubin: 0.5 mg/dL (ref 0.2–1.2)

## 2016-03-01 MED ORDER — ATORVASTATIN CALCIUM 20 MG PO TABS: 20.0000 mg | ORAL_TABLET | Freq: Every day | ORAL | Status: DC

## 2016-03-01 MED ORDER — ATORVASTATIN CALCIUM 10 MG PO TABS: 10.0000 mg | ORAL_TABLET | Freq: Every day | ORAL | Status: DC

## 2016-03-01 NOTE — Assessment & Plan Note (Signed)
stable overall by history and exam, recent data reviewed with pt, and pt to continue medical treatment as before,  to f/u any worsening symptoms or concerns Lab Results  Component Value Date   LDLCALC 100* 02/14/2015

## 2016-03-01 NOTE — Assessment & Plan Note (Signed)

## 2016-03-01 NOTE — Assessment & Plan Note (Signed)
Resolved, pt reassured, ok to hold on further tx at this time, consider re-start the topical steroid if recurs

## 2016-03-01 NOTE — Progress Notes (Signed)
Subjective:    Patient ID: Madison Bradley, female    DOB: [birth year], 82 y.o.   MRN: [id number]  HPI  Here for wellness and f/u;  Overall doing ok;  Pt denies Chest pain, worsening SOB, DOE, wheezing, orthopnea, PND, worsening LE edema, palpitations, dizziness or syncope.  Pt denies neurological change such as new headache, facial or extremity weakness.  Pt denies polydipsia, polyuria, or low sugar symptoms. Pt states overall good compliance with treatment and medications, good tolerability, and has been trying to follow appropriate diet.  Pt denies worsening depressive symptoms, suicidal ideation or panic. No fever, night sweats, wt loss, loss of appetite, or other constitutional symptoms.  Pt states good ability with ADL's, has low fall risk, home safety reviewed and adequate, no other significant changes in hearing or vision, and only occasionally active with exercise. Has had dermatitis to both distal lower legs, tx with topical steroid and has done well in last few months with much less itching and erythema.  No ulcers or fever. Does have some distal leg swelling as well, reduced at night, worse by the next evening, chronic stable in the past few yrs. No hx of chf. Ran out ot fhe lipitor for 2 wks, needs refill.  Has occas bilat knee pain, some improved recently, takes tylenol prn, did have to have cortisone shots a few yrs ago that really helped.  No giveaways or falls.  Trying to follow lower chol diet.   Past Medical History  Diagnosis Date  . Hyperlipidemia   . Hypertension   . Colon polyps 2009  . Family hx of colon cancer   . IBS (irritable bowel syndrome)    Past Surgical History  Procedure Laterality Date  . Colonoscopy with polypectomy      Dr Sammuel Cooper , last 2009  . Cataract extraction, bilateral      SE Ophth  . Carpal tunnel release    . Mastoidectomy      L @ 41 mos old  . Tonsillectomy and adenoidectomy    . Knee arthroscopy      reports that she has never smoked. She  does not have any smokeless tobacco history on file. She reports that she drinks alcohol. She reports that she does not use illicit drugs. family history includes Cancer in her daughter and son; Colon cancer in her sister; Heart attack in her sister; Stomach cancer in her sister; Stroke (age of onset: 83) in her mother. There is no history of Diabetes. No Known Allergies Current Outpatient Prescriptions on File Prior to Visit  Medication Sig Dispense Refill  . aspirin 81 MG tablet Take 81 mg by mouth. 2 by mouth every evening    . losartan (COZAAR) 100 MG tablet TAKE 1 TABLET DAILY QHS 90 tablet 3  . metoprolol succinate (TOPROL-XL) 50 MG 24 hr tablet Take 1 tablet (50 mg total) by mouth daily. Must keep appt w/new PCP for future refills 30 tablet 0  . triamterene-hydrochlorothiazide (MAXZIDE-25) 37.5-25 MG tablet Take 0.5 tablets by mouth daily. --- Please establish with new PCP for further refills 30 tablet 0   No current facility-administered medications on file prior to visit.    Review of Systems Constitutional: Negative for increased diaphoresis, or other activity, appetite or siginficant weight change other than noted HENT: Negative for worsening hearing loss, ear pain, facial swelling, mouth sores and neck stiffness.   Eyes: Negative for other worsening pain, redness or visual disturbance.  Respiratory: Negative for choking or stridor  Cardiovascular: Negative for other chest pain and palpitations.  Gastrointestinal: Negative for worsening diarrhea, blood in stool, or abdominal distention Genitourinary: Negative for hematuria, flank pain or change in urine volume.  Musculoskeletal: Negative for myalgias or other joint complaints.  Skin: Negative for other color change and wound or drainage.  Neurological: Negative for syncope and numbness. other than noted Hematological: Negative for adenopathy. or other swelling Psychiatric/Behavioral: Negative for hallucinations, SI, self-injury,  decreased concentration or other worsening agitation.      Objective:   Physical Exam BP 132/82 mmHg  Pulse 79  Temp(Src) 97.9 F (36.6 C) (Oral)  Resp 20  Wt 161 lb (73.029 kg)  SpO2 93% VS noted,  Constitutional: Pt is oriented to person, place, and time. Appears well-developed and well-nourished, in no significant distress Head: Normocephalic and atraumatic  Eyes: Conjunctivae and EOM are normal. Pupils are equal, round, and reactive to light Right Ear: External ear normal.  Left Ear: External ear normal Nose: Nose normal.  Mouth/Throat: Oropharynx is clear and moist  Neck: Normal range of motion. Neck supple. No JVD present. No tracheal deviation present or significant neck LA or mass Cardiovascular: Normal rate, regular rhythm, normal heart sounds and intact distal pulses.   Pulmonary/Chest: Effort normal and breath sounds without rales or wheezing  Abdominal: Soft. Bowel sounds are normal. NT. No HSM  Musculoskeletal: Normal range of motion. Exhibits trace ankle bilat edema, mult varicosities Lymphadenopathy: Has no cervical adenopathy.  Neurological: Pt is alert and oriented to person, place, and time. Pt has normal reflexes. No cranial nerve deficit. Motor grossly intact Skin: Skin is warm and dry. No rash noted or new ulcers Psychiatric:  Has normal mood and affect. Behavior is normal.     Assessment & Plan:

## 2016-03-01 NOTE — Assessment & Plan Note (Signed)
stable overall by history and exam, recent data reviewed with pt, and pt to continue medical treatment as before,  to f/u any worsening symptoms or concerns BP Readings from Last 3 Encounters:  03/01/16 132/82  02/14/15 130/80  11/11/13 137/73

## 2016-03-01 NOTE — Progress Notes (Signed)
Pre visit review using our clinic review tool, if applicable. No additional management support is needed unless otherwise documented below in the visit note. 

## 2016-03-01 NOTE — Patient Instructions (Addendum)
You had the new Prevnar 13 pneumonia shot today  Please schedule the bone density test before leaving today at the scheduling desk (where you check out)  You can consider seeing Dr Smith/sports medicine for your knees by making an appt at the Scheduling desk if you like  Please continue all other medications as before, and refills have been done if requested.  Please have the pharmacy call with any other refills you may need.  Please continue your efforts at being more active, low cholesterol diet, and weight control.  You are otherwise up to date with prevention measures today.  Please keep your appointments with your specialists as you may have planned  Please go to the LAB in the Basement (turn left off the elevator) for the tests to be done today  You will be contacted by phone if any changes need to be made immediately.  Otherwise, you will receive a letter about your results with an explanation, but please check with MyChart first.  Please remember to sign up for MyChart if you have not done so, as this will be important to you in the future with finding out test results, communicating by private email, and scheduling acute appointments online when needed.  Please return in 1 year for your yearly visit, or sooner if needed, with Lab testing done 3-5 days before

## 2016-03-01 NOTE — Assessment & Plan Note (Addendum)
Mild, for leg elevation, low salt, wt control, consider compression stockings, cont diuretic,  to f/u any worsening symptoms or concerns  In addition to the time spent performing CPE, I spent an additional 40 minutes face to face,in which greater than 50% of this time was spent in counseling and coordination of care for patient's acute illness as documented.

## 2016-03-01 NOTE — Addendum Note (Signed)
Addended by: Della Goo C on: 03/01/2016 03:19 PM   Modules accepted: Orders

## 2016-03-05 ENCOUNTER — Telehealth: Payer: Self-pay

## 2016-03-05 MED ORDER — METOPROLOL SUCCINATE ER 50 MG PO TB24: 50.0000 mg | ORAL_TABLET | Freq: Every day | ORAL | Status: DC

## 2016-03-05 NOTE — Telephone Encounter (Signed)
Pt rq rf of metoprolol. Emmett done for 90 and 1

## 2016-03-15 ENCOUNTER — Other Ambulatory Visit (INDEPENDENT_AMBULATORY_CARE_PROVIDER_SITE_OTHER): Payer: Medicare Other

## 2016-03-15 ENCOUNTER — Encounter: Payer: Self-pay | Admitting: Internal Medicine

## 2016-03-15 ENCOUNTER — Ambulatory Visit (INDEPENDENT_AMBULATORY_CARE_PROVIDER_SITE_OTHER)
Admission: RE | Admit: 2016-03-15 | Discharge: 2016-03-15 | Disposition: A | Discharge status: Home / Self Care | Payer: Medicare Other | Source: Ambulatory Visit | Attending: Internal Medicine | Admitting: Internal Medicine

## 2016-03-15 DIAGNOSIS — R6889 Other general symptoms and signs: Secondary | ICD-10-CM

## 2016-03-15 DIAGNOSIS — Z0001 Encounter for general adult medical examination with abnormal findings: Secondary | ICD-10-CM

## 2016-03-15 DIAGNOSIS — Z13820 Encounter for screening for osteoporosis: Secondary | ICD-10-CM

## 2016-03-15 LAB — URINALYSIS, ROUTINE W REFLEX MICROSCOPIC
BILIRUBIN URINE: NEGATIVE
Hgb urine dipstick: NEGATIVE
Ketones, ur: NEGATIVE
Nitrite: NEGATIVE
PH: 6 (ref 5.0–8.0)
SPECIFIC GRAVITY, URINE: 1.01 (ref 1.000–1.030)
TOTAL PROTEIN, URINE-UPE24: NEGATIVE
Urine Glucose: NEGATIVE
Urobilinogen, UA: 0.2 (ref 0.0–1.0)

## 2016-03-15 LAB — CBC WITH DIFFERENTIAL/PLATELET
Basophils Absolute: 0.1 10*3/uL (ref 0.0–0.1)
Basophils Relative: 1 % (ref 0.0–3.0)
EOS PCT: 2.6 % (ref 0.0–5.0)
Eosinophils Absolute: 0.1 10*3/uL (ref 0.0–0.7)
HCT: 38.6 % (ref 36.0–46.0)
Hemoglobin: 13.1 g/dL (ref 12.0–15.0)
LYMPHS ABS: 1.8 10*3/uL (ref 0.7–4.0)
Lymphocytes Relative: 34.1 % (ref 12.0–46.0)
MCHC: 33.8 g/dL (ref 30.0–36.0)
MCV: 83.9 fl (ref 78.0–100.0)
MONO ABS: 0.7 10*3/uL (ref 0.1–1.0)
MONOS PCT: 13.5 % — AB (ref 3.0–12.0)
NEUTROS ABS: 2.6 10*3/uL (ref 1.4–7.7)
NEUTROS PCT: 48.8 % (ref 43.0–77.0)
PLATELETS: 253 10*3/uL (ref 150.0–400.0)
RBC: 4.6 Mil/uL (ref 3.87–5.11)
RDW: 14 % (ref 11.5–15.5)
WBC: 5.3 10*3/uL (ref 4.0–10.5)

## 2016-03-15 LAB — BASIC METABOLIC PANEL
BUN: 28 mg/dL — ABNORMAL HIGH (ref 6–23)
CO2: 28 meq/L (ref 19–32)
Calcium: 9.3 mg/dL (ref 8.4–10.5)
Chloride: 103 mEq/L (ref 96–112)
Creatinine, Ser: 1.02 mg/dL (ref 0.40–1.20)
GFR: 54.35 mL/min — AB (ref >60.00)
GLUCOSE: 76 mg/dL (ref 70–99)
POTASSIUM: 4.4 meq/L (ref 3.5–5.1)
SODIUM: 137 meq/L (ref 135–145)

## 2016-03-15 LAB — HEPATIC FUNCTION PANEL
ALBUMIN: 4 g/dL (ref 3.5–5.2)
ALK PHOS: 61 U/L (ref 39–117)
ALT: 11 U/L (ref 0–35)
AST: 14 U/L (ref 0–37)
BILIRUBIN DIRECT: 0.1 mg/dL (ref 0.0–0.3)
Total Bilirubin: 0.5 mg/dL (ref 0.2–1.2)
Total Protein: 6.6 g/dL (ref 6.0–8.3)

## 2016-03-15 LAB — TSH: TSH: 3.08 u[IU]/mL (ref 0.35–4.50)

## 2016-03-15 LAB — LIPID PANEL
CHOLESTEROL: 198 mg/dL (ref 0–200)
HDL: 51 mg/dL (ref >39.00)
LDL CALC: 126 mg/dL — AB (ref 0–99)
NONHDL: 147.14
Total CHOL/HDL Ratio: 4
Triglycerides: 104 mg/dL (ref 0.0–149.0)
VLDL: 20.8 mg/dL (ref 0.0–40.0)

## 2016-03-20 ENCOUNTER — Other Ambulatory Visit: Payer: Self-pay | Admitting: Internal Medicine

## 2016-03-20 ENCOUNTER — Encounter: Payer: Self-pay | Admitting: Internal Medicine

## 2016-03-20 MED ORDER — ALENDRONATE SODIUM 70 MG PO TABS
70.0000 mg | ORAL_TABLET | ORAL | Status: DC
Start: 2016-03-20 — End: 2016-07-03

## 2016-04-04 ENCOUNTER — Other Ambulatory Visit: Payer: Self-pay | Admitting: Internal Medicine

## 2016-04-07 ENCOUNTER — Other Ambulatory Visit: Payer: Self-pay | Admitting: Internal Medicine

## 2016-07-03 ENCOUNTER — Other Ambulatory Visit: Payer: Self-pay | Admitting: *Deleted

## 2016-07-03 MED ORDER — LOSARTAN POTASSIUM 100 MG PO TABS: 100.0000 mg | ORAL_TABLET | Freq: Every day | ORAL | 1 refills | Status: DC

## 2016-07-03 MED ORDER — TRIAMTERENE-HCTZ 37.5-25 MG PO TABS: ORAL_TABLET | ORAL | 1 refills | Status: DC

## 2016-07-03 MED ORDER — METOPROLOL SUCCINATE ER 50 MG PO TB24: 50.0000 mg | ORAL_TABLET | Freq: Every day | ORAL | 1 refills | Status: DC

## 2016-07-03 MED ORDER — ATORVASTATIN CALCIUM 20 MG PO TABS: 20.0000 mg | ORAL_TABLET | Freq: Every day | ORAL | 1 refills | Status: DC

## 2016-07-03 MED ORDER — ALENDRONATE SODIUM 70 MG PO TABS: 70.0000 mg | ORAL_TABLET | ORAL | 1 refills | Status: DC

## 2016-07-03 NOTE — Telephone Encounter (Signed)
Left msg on triage stating [facility]. Called pt back no answer LMOM refills has been sent...Madison Bradley

## 2016-09-04 ENCOUNTER — Other Ambulatory Visit: Payer: Self-pay | Admitting: Internal Medicine

## 2017-02-22 ENCOUNTER — Other Ambulatory Visit: Payer: Self-pay | Admitting: Internal Medicine

## 2017-02-25 ENCOUNTER — Other Ambulatory Visit: Payer: Self-pay | Admitting: Internal Medicine

## 2017-03-01 ENCOUNTER — Other Ambulatory Visit: Payer: Self-pay | Admitting: Internal Medicine

## 2017-03-07 ENCOUNTER — Ambulatory Visit: Payer: Medicare Other | Admitting: Internal Medicine

## 2017-03-08 ENCOUNTER — Ambulatory Visit (INDEPENDENT_AMBULATORY_CARE_PROVIDER_SITE_OTHER): Payer: Medicare Other | Admitting: Internal Medicine

## 2017-03-08 ENCOUNTER — Other Ambulatory Visit (INDEPENDENT_AMBULATORY_CARE_PROVIDER_SITE_OTHER): Payer: Medicare Other

## 2017-03-08 ENCOUNTER — Encounter: Payer: Self-pay | Admitting: Internal Medicine

## 2017-03-08 VITALS — BP 142/90 | HR 61 | Temp 97.6°F | Ht 67.0 in | Wt 165.1 lb

## 2017-03-08 DIAGNOSIS — Z0001 Encounter for general adult medical examination with abnormal findings: Secondary | ICD-10-CM

## 2017-03-08 DIAGNOSIS — Z23 Encounter for immunization: Secondary | ICD-10-CM | POA: Diagnosis not present

## 2017-03-08 DIAGNOSIS — I10 Essential (primary) hypertension: Secondary | ICD-10-CM

## 2017-03-08 DIAGNOSIS — Z0000 Encounter for general adult medical examination without abnormal findings: Secondary | ICD-10-CM

## 2017-03-08 LAB — HEPATIC FUNCTION PANEL
ALK PHOS: 59 U/L (ref 39–117)
ALT: 14 U/L (ref 0–35)
AST: 16 U/L (ref 0–37)
Albumin: 4.2 g/dL (ref 3.5–5.2)
BILIRUBIN DIRECT: 0.1 mg/dL (ref 0.0–0.3)
TOTAL PROTEIN: 6.9 g/dL (ref 6.0–8.3)
Total Bilirubin: 0.6 mg/dL (ref 0.2–1.2)

## 2017-03-08 LAB — CBC WITH DIFFERENTIAL/PLATELET
BASOS ABS: 0.1 10*3/uL (ref 0.0–0.1)
Basophils Relative: 1.1 % (ref 0.0–3.0)
EOS ABS: 0.1 10*3/uL (ref 0.0–0.7)
Eosinophils Relative: 2.5 % (ref 0.0–5.0)
HCT: 39 % (ref 36.0–46.0)
Hemoglobin: 13 g/dL (ref 12.0–15.0)
LYMPHS ABS: 1.9 10*3/uL (ref 0.7–4.0)
Lymphocytes Relative: 33.5 % (ref 12.0–46.0)
MCHC: 33.3 g/dL (ref 30.0–36.0)
MCV: 86.5 fl (ref 78.0–100.0)
Monocytes Absolute: 1 10*3/uL (ref 0.1–1.0)
Monocytes Relative: 17.8 % — ABNORMAL HIGH (ref 3.0–12.0)
NEUTROS ABS: 2.6 10*3/uL (ref 1.4–7.7)
NEUTROS PCT: 45.1 % (ref 43.0–77.0)
PLATELETS: 250 10*3/uL (ref 150.0–400.0)
RBC: 4.51 Mil/uL (ref 3.87–5.11)
RDW: 14.1 % (ref 11.5–15.5)
WBC: 5.8 10*3/uL (ref 4.0–10.5)

## 2017-03-08 LAB — LIPID PANEL
CHOLESTEROL: 185 mg/dL (ref 0–200)
HDL: 60.2 mg/dL (ref >39.00)
LDL Cholesterol: 102 mg/dL — ABNORMAL HIGH (ref 0–99)
NONHDL: 124.57
Total CHOL/HDL Ratio: 3
Triglycerides: 115 mg/dL (ref 0.0–149.0)
VLDL: 23 mg/dL (ref 0.0–40.0)

## 2017-03-08 LAB — BASIC METABOLIC PANEL
BUN: 30 mg/dL — ABNORMAL HIGH (ref 6–23)
CHLORIDE: 101 meq/L (ref 96–112)
CO2: 28 meq/L (ref 19–32)
Calcium: 9.2 mg/dL (ref 8.4–10.5)
Creatinine, Ser: 0.99 mg/dL (ref 0.40–1.20)
GFR: 56.13 mL/min — ABNORMAL LOW (ref >60.00)
GLUCOSE: 87 mg/dL (ref 70–99)
Potassium: 4.6 mEq/L (ref 3.5–5.1)
SODIUM: 135 meq/L (ref 135–145)

## 2017-03-08 LAB — URINALYSIS, ROUTINE W REFLEX MICROSCOPIC
Bilirubin Urine: NEGATIVE
Hgb urine dipstick: NEGATIVE
Ketones, ur: NEGATIVE
Nitrite: NEGATIVE
SPECIFIC GRAVITY, URINE: 1.015 (ref 1.000–1.030)
TOTAL PROTEIN, URINE-UPE24: NEGATIVE
URINE GLUCOSE: NEGATIVE
UROBILINOGEN UA: 0.2 (ref 0.0–1.0)
pH: 6 (ref 5.0–8.0)

## 2017-03-08 LAB — TSH: TSH: 3.02 u[IU]/mL (ref 0.35–4.50)

## 2017-03-08 MED ORDER — LOSARTAN POTASSIUM 100 MG PO TABS: 100.0000 mg | ORAL_TABLET | Freq: Every day | ORAL | 3 refills | Status: DC

## 2017-03-08 MED ORDER — ATORVASTATIN CALCIUM 20 MG PO TABS: 20.0000 mg | ORAL_TABLET | Freq: Every day | ORAL | 3 refills | Status: DC

## 2017-03-08 MED ORDER — TRIAMTERENE-HCTZ 37.5-25 MG PO TABS: ORAL_TABLET | ORAL | 3 refills | Status: DC

## 2017-03-08 MED ORDER — ALENDRONATE SODIUM 70 MG PO TABS: 70.0000 mg | ORAL_TABLET | ORAL | 3 refills | Status: DC

## 2017-03-08 NOTE — Assessment & Plan Note (Signed)
stable overall by history and exam, recent data reviewed with pt, and pt to continue medical treatment as before,  to f/u any worsening symptoms or concerns BP Readings from Last 3 Encounters:  03/08/17 (!) 142/90  03/01/16 132/82  02/14/15 130/80

## 2017-03-08 NOTE — Assessment & Plan Note (Signed)

## 2017-03-08 NOTE — Patient Instructions (Addendum)
You had the Tdap tetanus shot today  Please continue all other medications as before, and refills have been done if requested.  Please have the pharmacy call with any other refills you may need.  Please continue your efforts at being more active, low cholesterol diet, and weight control.  You are otherwise up to date with prevention measures today.  Please keep your appointments with your specialists as you may have planned  Please go to the LAB in the Basement (turn left off the elevator) for the tests to be done today  You will be contacted by phone if any changes need to be made immediately.  Otherwise, you will receive a letter about your results with an explanation, but please check with MyChart first.  Please remember to sign up for MyChart if you have not done so, as this will be important to you in the future with finding out test results, communicating by private email, and scheduling acute appointments online when needed.  If you have Medicare related insurance (such as traditional Medicare, Blue Cross Medicare or United Health[employer] Medicare, or similar), Please make an appointment at the Scheduling desk with Jill, the Wellness [facility], for your Wellness Visit in this office, which is a benefit with your insurance.  Please return in 1 year for your yearly visit, or sooner if needed, with Lab testing done 3-5 days before   

## 2017-03-08 NOTE — Progress Notes (Signed)
Subjective:    Patient ID: Madison Bradley, female    DOB: 06/24/1926, 83 y.o.   MRN: [id number]  HPI  Here for wellness and f/u;  Overall doing ok;  Pt denies Chest pain, worsening SOB, DOE, wheezing, orthopnea, PND, worsening LE edema, palpitations, dizziness or syncope.  Pt denies neurological change such as new headache, facial or extremity weakness.  Pt denies polydipsia, polyuria, or low sugar symptoms. Pt states overall good compliance with treatment and medications, good tolerability, and has been trying to follow appropriate diet.  Pt denies worsening depressive symptoms, suicidal ideation or panic. No fever, night sweats, wt loss, loss of appetite, or other constitutional symptoms.  Pt states good ability with ADL's, has low fall risk, home safety reviewed and adequate, no other significant changes in hearing or vision, and not active with exercise. No other new hx Wt Readings from Last 3 Encounters:  03/08/17 165 lb 1.9 oz (74.9 kg)  03/01/16 161 lb (73 kg)  02/14/15 162 lb 8 oz (73.7 kg)   BP Readings from Last 3 Encounters:  03/08/17 (!) 142/90  03/01/16 132/82  02/14/15 130/80   Past Medical History:  Diagnosis Date  . Colon polyps 2009  . Family hx of colon cancer   . Hyperlipidemia   . Hypertension   . IBS (irritable bowel syndrome)    Past Surgical History:  Procedure Laterality Date  . CARPAL TUNNEL RELEASE    . CATARACT EXTRACTION, BILATERAL     SE Ophth  . colonoscopy with polypectomy     Dr Sammuel Cooper , last 2009  . KNEE ARTHROSCOPY    . MASTOIDECTOMY     L @ 64 mos old  . TONSILLECTOMY AND ADENOIDECTOMY      reports that she has never smoked. She has never used smokeless tobacco. She reports that she drinks alcohol. She reports that she does not use drugs. family history includes Cancer in her daughter and son; Colon cancer in her sister; Heart attack in her sister; Stomach cancer in her sister; Stroke (age of onset: 45) in her mother. No Known  Allergies Current Outpatient Prescriptions on File Prior to Visit  Medication Sig Dispense Refill  . aspirin 81 MG tablet Take 81 mg by mouth daily.     . metoprolol succinate (TOPROL-XL) 50 MG 24 hr tablet TAKE 1 TABLET (50 MG TOTAL) BY MOUTH DAILY 30 tablet 0   No current facility-administered medications on file prior to visit.    Review of Systems Constitutional: Negative for other unusual diaphoresis, sweats, appetite or weight changes HENT: Negative for other worsening hearing loss, ear pain, facial swelling, mouth sores or neck stiffness.   Eyes: Negative for other worsening pain, redness or other visual disturbance.  Respiratory: Negative for other stridor or swelling Cardiovascular: Negative for other palpitations or other chest pain  Gastrointestinal: Negative for worsening diarrhea or loose stools, blood in stool, distention or other pain Genitourinary: Negative for hematuria, flank pain or other change in urine volume.  Musculoskeletal: Negative for myalgias or other joint swelling.  Skin: Negative for other color change, or other wound or worsening drainage.  Neurological: Negative for other syncope or numbness. Hematological: Negative for other adenopathy or swelling Psychiatric/Behavioral: Negative for hallucinations, other worsening agitation, SI, self-injury, or new decreased concentration All other system neg per pt    Objective:   Physical Exam BP (!) 142/90 (BP Location: Left Arm, Patient Position: Sitting, Cuff Size: Normal)   Pulse 61   Temp 97.6 F (  36.4 C) (Oral)   Ht 5\' 7"  (1.702 m)   Wt 165 lb 1.9 oz (74.9 kg)   SpO2 98%   BMI 25.86 kg/m  VS noted, normal wt Constitutional: Pt is oriented to person, place, and time. Appears well-developed and well-nourished, in no significant distress and comfortable Head: Normocephalic and atraumatic  Eyes: Conjunctivae and EOM are normal. Pupils are equal, round, and reactive to light Right Ear: External ear normal  without discharge Left Ear: External ear normal without discharge Nose: Nose without discharge or deformity Mouth/Throat: Oropharynx is without other ulcerations and moist  Neck: Normal range of motion. Neck supple. No JVD present. No tracheal deviation present or significant neck LA or mass Cardiovascular: Normal rate, regular rhythm, normal heart sounds and intact distal pulses.   Pulmonary/Chest: WOB normal and breath sounds without rales or wheezing  Abdominal: Soft. Bowel sounds are normal. NT. No HSM  Musculoskeletal: Normal range of motion. Exhibits no edema, has some thoracic spine kyphosis Lymphadenopathy: Has no other cervical adenopathy.  Neurological: Pt is alert and oriented to person, place, and time. Pt has normal reflexes. No cranial nerve deficit. Motor grossly intact, Gait intact, memory intact Skin: Skin is warm and dry. No rash noted or new ulcerations Psychiatric:  Has normal mood and affect. Behavior is normal without agitation No other exam findings    Assessment & Plan:

## 2017-03-09 ENCOUNTER — Encounter: Payer: Self-pay | Admitting: Internal Medicine

## 2017-03-13 ENCOUNTER — Other Ambulatory Visit: Payer: Self-pay | Admitting: Internal Medicine

## 2017-03-25 ENCOUNTER — Telehealth: Payer: Self-pay | Admitting: *Deleted

## 2017-03-25 MED ORDER — LOSARTAN POTASSIUM 100 MG PO TABS: 100.0000 mg | ORAL_TABLET | Freq: Every day | ORAL | 0 refills | Status: DC

## 2017-03-25 NOTE — Telephone Encounter (Signed)
Rec'd [facility] for losartan only have 2 pills left. Requesting a 10 day supply to be sent to [facility]. Verified chart pt is updated [facility].../lmb

## 2017-04-04 ENCOUNTER — Other Ambulatory Visit: Payer: Self-pay | Admitting: Internal Medicine

## 2017-08-29 ENCOUNTER — Other Ambulatory Visit: Payer: Self-pay | Admitting: Internal Medicine

## 2017-11-28 ENCOUNTER — Telehealth: Payer: Self-pay | Admitting: Internal Medicine

## 2017-11-28 DIAGNOSIS — R109 Unspecified abdominal pain: Secondary | ICD-10-CM

## 2017-11-28 NOTE — Telephone Encounter (Signed)
Ok, this is done 

## 2017-11-28 NOTE — Telephone Encounter (Signed)
Copied from [place]. Topic: Referral - Request >> Nov 28, 2017  1:33 PM Corie Chiquito, [place] wrote: Reason for CRM: Patient calling because she would like to have a referral to the GI doctor. Stated that she has IBS and would like to be checked out by them. If someone could give her a call back about this at [phone] or [phone]

## 2018-01-13 ENCOUNTER — Encounter: Payer: Self-pay | Admitting: Internal Medicine

## 2018-02-25 ENCOUNTER — Other Ambulatory Visit: Payer: Self-pay | Admitting: Internal Medicine

## 2018-02-28 ENCOUNTER — Other Ambulatory Visit: Payer: Self-pay | Admitting: Internal Medicine

## 2018-03-03 ENCOUNTER — Other Ambulatory Visit: Payer: Self-pay | Admitting: Internal Medicine

## 2018-03-12 ENCOUNTER — Ambulatory Visit (INDEPENDENT_AMBULATORY_CARE_PROVIDER_SITE_OTHER): Payer: Medicare Other | Admitting: Internal Medicine

## 2018-03-12 ENCOUNTER — Encounter: Payer: Self-pay | Admitting: Internal Medicine

## 2018-03-12 ENCOUNTER — Other Ambulatory Visit (INDEPENDENT_AMBULATORY_CARE_PROVIDER_SITE_OTHER): Payer: Medicare Other

## 2018-03-12 VITALS — BP 122/78 | HR 82 | Temp 98.1°F | Ht 67.0 in | Wt 163.0 lb

## 2018-03-12 DIAGNOSIS — Z0000 Encounter for general adult medical examination without abnormal findings: Secondary | ICD-10-CM | POA: Diagnosis not present

## 2018-03-12 DIAGNOSIS — I10 Essential (primary) hypertension: Secondary | ICD-10-CM | POA: Diagnosis not present

## 2018-03-12 DIAGNOSIS — Z23 Encounter for immunization: Secondary | ICD-10-CM

## 2018-03-12 LAB — CBC WITH DIFFERENTIAL/PLATELET
Basophils Absolute: 0.1 10*3/uL (ref 0.0–0.1)
Basophils Relative: 1 % (ref 0.0–3.0)
Eosinophils Absolute: 0.1 10*3/uL (ref 0.0–0.7)
Eosinophils Relative: 2.1 % (ref 0.0–5.0)
HCT: 38.9 % (ref 36.0–46.0)
Hemoglobin: 13.1 g/dL (ref 12.0–15.0)
Lymphocytes Relative: 28.6 % (ref 12.0–46.0)
Lymphs Abs: 1.5 10*3/uL (ref 0.7–4.0)
MCHC: 33.6 g/dL (ref 30.0–36.0)
MCV: 85.8 fl (ref 78.0–100.0)
Monocytes Absolute: 0.9 10*3/uL (ref 0.1–1.0)
Monocytes Relative: 16.8 % — ABNORMAL HIGH (ref 3.0–12.0)
Neutro Abs: 2.6 10*3/uL (ref 1.4–7.7)
Neutrophils Relative %: 51.5 % (ref 43.0–77.0)
Platelets: 260 10*3/uL (ref 150.0–400.0)
RBC: 4.54 Mil/uL (ref 3.87–5.11)
RDW: 14.2 % (ref 11.5–15.5)
WBC: 5.1 10*3/uL (ref 4.0–10.5)

## 2018-03-12 LAB — BASIC METABOLIC PANEL
BUN: 28 mg/dL — ABNORMAL HIGH (ref 6–23)
CALCIUM: 9.3 mg/dL (ref 8.4–10.5)
CHLORIDE: 104 meq/L (ref 96–112)
CO2: 30 mEq/L (ref 19–32)
CREATININE: 1.14 mg/dL (ref 0.40–1.20)
GFR: 47.59 mL/min — ABNORMAL LOW (ref >60.00)
Glucose, Bld: 61 mg/dL — ABNORMAL LOW (ref 70–99)
Potassium: 4.5 mEq/L (ref 3.5–5.1)
SODIUM: 139 meq/L (ref 135–145)

## 2018-03-12 LAB — URINALYSIS, ROUTINE W REFLEX MICROSCOPIC
Bilirubin Urine: NEGATIVE
Hgb urine dipstick: NEGATIVE
Ketones, ur: NEGATIVE
Nitrite: NEGATIVE
RBC / HPF: NONE SEEN (ref 0–?)
Specific Gravity, Urine: 1.02 (ref 1.000–1.030)
Total Protein, Urine: NEGATIVE
Urine Glucose: NEGATIVE
Urobilinogen, UA: 0.2 (ref 0.0–1.0)
pH: 6 (ref 5.0–8.0)

## 2018-03-12 LAB — LIPID PANEL
CHOLESTEROL: 175 mg/dL (ref 0–200)
HDL: 54.9 mg/dL (ref >39.00)
LDL Cholesterol: 103 mg/dL — ABNORMAL HIGH (ref 0–99)
NonHDL: 120.44
TRIGLYCERIDES: 85 mg/dL (ref 0.0–149.0)
Total CHOL/HDL Ratio: 3
VLDL: 17 mg/dL (ref 0.0–40.0)

## 2018-03-12 LAB — HEPATIC FUNCTION PANEL
ALT: 9 U/L (ref 0–35)
AST: 11 U/L (ref 0–37)
Albumin: 3.9 g/dL (ref 3.5–5.2)
Alkaline Phosphatase: 54 U/L (ref 39–117)
Bilirubin, Direct: 0.1 mg/dL (ref 0.0–0.3)
Total Bilirubin: 0.5 mg/dL (ref 0.2–1.2)
Total Protein: 6.6 g/dL (ref 6.0–8.3)

## 2018-03-12 LAB — TSH: TSH: 2.84 u[IU]/mL (ref 0.35–4.50)

## 2018-03-12 MED ORDER — TRIAMTERENE-HCTZ 37.5-25 MG PO TABS: 0.5000 | ORAL_TABLET | Freq: Every day | ORAL | 3 refills | Status: DC

## 2018-03-12 MED ORDER — ALENDRONATE SODIUM 70 MG PO TABS: 70.0000 mg | ORAL_TABLET | ORAL | 3 refills | Status: DC

## 2018-03-12 MED ORDER — LOSARTAN POTASSIUM 100 MG PO TABS: 100.0000 mg | ORAL_TABLET | Freq: Every day | ORAL | 3 refills | Status: DC

## 2018-03-12 MED ORDER — METOPROLOL SUCCINATE ER 50 MG PO TB24: 50.0000 mg | ORAL_TABLET | Freq: Every day | ORAL | 3 refills | Status: DC

## 2018-03-12 MED ORDER — ATORVASTATIN CALCIUM 20 MG PO TABS: 20.0000 mg | ORAL_TABLET | Freq: Every day | ORAL | 3 refills | Status: DC

## 2018-03-12 NOTE — Assessment & Plan Note (Signed)

## 2018-03-12 NOTE — Patient Instructions (Addendum)
You had the Prevnar pneumonia shot today  Please continue all other medications as before, and refills have been done if requested.  Please have the pharmacy call with any other refills you may need.  Please continue your efforts at being more active, low cholesterol diet, and weight control.  You are otherwise up to date with prevention measures today.  Please keep your appointments with your specialists as you may have planned  Please go to the LAB in the Basement (turn left off the elevator) for the tests to be done today  You will be contacted by phone if any changes need to be made immediately.  Otherwise, you will receive a letter about your results with an explanation, but please check with MyChart first.  Please remember to sign up for MyChart if you have not done so, as this will be important to you in the future with finding out test results, communicating by private email, and scheduling acute appointments online when needed.  Please return in 6 months, or sooner if needed 

## 2018-03-12 NOTE — Assessment & Plan Note (Signed)
stable overall by history and exam, recent data reviewed with pt, and pt to continue medical treatment as before,  to f/u any worsening symptoms or concerns BP Readings from Last 3 Encounters:  03/12/18 122/78  03/08/17 (!) 142/90  03/01/16 132/82

## 2018-03-12 NOTE — Progress Notes (Signed)
Subjective:    Patient ID: Madison Bradley, female    DOB: 07-27-1926, 84 y.o.   MRN: [id number]  HPI  Here for wellness and f/u;  Overall doing ok;  Pt denies Chest pain, worsening SOB, DOE, wheezing, orthopnea, PND, worsening LE edema, palpitations, dizziness or syncope.  Pt denies neurological change such as new headache, facial or extremity weakness.  Pt denies polydipsia, polyuria, or low sugar symptoms. Pt states overall good compliance with treatment and medications, good tolerability, and has been trying to follow appropriate diet.  Pt denies worsening depressive symptoms, suicidal ideation or panic. No fever, night sweats, wt loss, loss of appetite, or other constitutional symptoms.  Pt states good ability with ADL's, has low fall risk, home safety reviewed and adequate, no other significant changes in hearing or vision, and occasionally active with exercise, remains active for age without complaint.  Now great GMA to 6 children.  Due for Prevnar, no new complaints or interval hx. Wt Readings from Last 3 Encounters:  03/12/18 163 lb (73.9 kg)  03/08/17 165 lb 1.9 oz (74.9 kg)  03/01/16 161 lb (73 kg)   Past Medical History:  Diagnosis Date  . Colon polyps 2009  . Family hx of colon cancer   . Hyperlipidemia   . Hypertension   . IBS (irritable bowel syndrome)    Past Surgical History:  Procedure Laterality Date  . CARPAL TUNNEL RELEASE    . CATARACT EXTRACTION, BILATERAL     SE Ophth  . colonoscopy with polypectomy     Dr Sammuel Cooper , last 2009  . KNEE ARTHROSCOPY    . MASTOIDECTOMY     L @ 40 mos old  . TONSILLECTOMY AND ADENOIDECTOMY      reports that she has never smoked. She has never used smokeless tobacco. She reports that she drinks alcohol. She reports that she does not use drugs. family history includes Cancer in her daughter and son; Colon cancer in her sister; Heart attack in her sister; Stomach cancer in her sister; Stroke (age of onset: 29) in her mother. No  Known Allergies Current Outpatient Medications on File Prior to Visit  Medication Sig Dispense Refill  . aspirin 81 MG tablet Take 81 mg by mouth daily.      No current facility-administered medications on file prior to visit.    Review of Systems Constitutional: Negative for other unusual diaphoresis, sweats, appetite or weight changes HENT: Negative for other worsening hearing loss, ear pain, facial swelling, mouth sores or neck stiffness.   Eyes: Negative for other worsening pain, redness or other visual disturbance.  Respiratory: Negative for other stridor or swelling Cardiovascular: Negative for other palpitations or other chest pain  Gastrointestinal: Negative for worsening diarrhea or loose stools, blood in stool, distention or other pain Genitourinary: Negative for hematuria, flank pain or other change in urine volume.  Musculoskeletal: Negative for myalgias or other joint swelling.  Skin: Negative for other color change, or other wound or worsening drainage.  Neurological: Negative for other syncope or numbness. Hematological: Negative for other adenopathy or swelling Psychiatric/Behavioral: Negative for hallucinations, other worsening agitation, SI, self-injury, or new decreased concentration All other system neg per pt    Objective:   Physical Exam BP 122/78   Pulse 82   Temp 98.1 F (36.7 C) (Oral)   Ht 5\' 7"  (1.702 m)   Wt 163 lb (73.9 kg)   SpO2 98%   BMI 25.53 kg/m  VS noted, remarkably able and active for  age Constitutional: Pt is oriented to person, place, and time. Appears well-developed and well-nourished, in no significant distress and comfortable Head: Normocephalic and atraumatic  Eyes: Conjunctivae and EOM are normal. Pupils are equal, round, and reactive to light Right Ear: External ear normal without discharge Left Ear: External ear normal without discharge Nose: Nose without discharge or deformity Mouth/Throat: Oropharynx is without other ulcerations  and moist  Neck: Normal range of motion. Neck supple. No JVD present. No tracheal deviation present or significant neck LA or mass Cardiovascular: Normal rate, regular rhythm, normal heart sounds and intact distal pulses.   Pulmonary/Chest: WOB normal and breath sounds without rales or wheezing  Abdominal: Soft. Bowel sounds are normal. NT. No HSM  Musculoskeletal: Normal range of motion. Exhibits no edema Lymphadenopathy: Has no other cervical adenopathy.  Neurological: Pt is alert and oriented to person, place, and time. Pt has normal reflexes. No cranial nerve deficit. Motor grossly intact, Gait intact Skin: Skin is warm and dry. No rash noted or new ulcerations Psychiatric:  Has normal mood and affect. Behavior is normal without agitation No other exam findings Lab Results  Component Value Date   WBC 5.8 03/08/2017   HGB 13.0 03/08/2017   HCT 39.0 03/08/2017   PLT 250.0 03/08/2017   GLUCOSE 87 03/08/2017   CHOL 185 03/08/2017   TRIG 115.0 03/08/2017   HDL 60.20 03/08/2017   LDLDIRECT 116.9 09/19/2009   LDLCALC 102 (H) 03/08/2017   ALT 14 03/08/2017   AST 16 03/08/2017   NA 135 03/08/2017   K 4.6 03/08/2017   CL 101 03/08/2017   CREATININE 0.99 03/08/2017   BUN 30 (H) 03/08/2017   CO2 28 03/08/2017   TSH 3.02 03/08/2017   INR 1.0 11/20/2008       Assessment & Plan:

## 2018-04-02 ENCOUNTER — Ambulatory Visit (INDEPENDENT_AMBULATORY_CARE_PROVIDER_SITE_OTHER): Payer: Medicare Other | Admitting: Podiatry

## 2018-04-02 ENCOUNTER — Encounter: Payer: Self-pay | Admitting: Podiatry

## 2018-04-02 VITALS — BP 150/90 | HR 68 | Resp 16

## 2018-04-02 DIAGNOSIS — M79675 Pain in left toe(s): Secondary | ICD-10-CM | POA: Diagnosis not present

## 2018-04-02 DIAGNOSIS — M79674 Pain in right toe(s): Secondary | ICD-10-CM | POA: Diagnosis not present

## 2018-04-02 DIAGNOSIS — B351 Tinea unguium: Secondary | ICD-10-CM

## 2018-04-02 DIAGNOSIS — L600 Ingrowing nail: Secondary | ICD-10-CM | POA: Diagnosis not present

## 2018-04-02 NOTE — Progress Notes (Signed)
Subjective:   Patient ID: Madison Bradley, female   DOB: 84 y.o.   MRN: [id number]   HPI Patient states she has a lot of pain in her nails secondary to the thickness and they get ingrown and she cannot take care of them herself.  States is been going on for a long time and she is wondering about removal of the nailbeds.  Patient does not smoke and likes to be active   Review of Systems  All other systems reviewed and are negative.       Objective:  Physical Exam  Constitutional: She appears well-developed and well-nourished.  Cardiovascular: Intact distal pulses.  Pulmonary/Chest: Effort normal.  Musculoskeletal: Normal range of motion.  Neurological: She is alert.  Skin: Skin is warm.  Nursing note and vitals reviewed.   Neurovascular status found to be intact muscle strength is adequate range of motion within normal limits with patient found to have severely thickened dystrophic deformed nailbeds 1 through 5 both feet that are incurvated in the corners and tender when palpated.  Patient is noted to have good digital perfusion is well oriented x3     Assessment:  Chronic mycotic nail infection with pain 1-5 both feet     Plan:  H&P conditions reviewed discussed.  Today I debrided nailbeds 1-5 both feet with no iatrogenic bleeding and instructed on routine care and she will be seen every 3 months for routine nail debridement and earlier if any issues should occur

## 2018-04-02 NOTE — Progress Notes (Signed)
   Subjective:    Patient ID: Madison Bradley, female    DOB: 09-29-1926, 84 y.o.   MRN: [id number]  HPI    Review of Systems  All other systems reviewed and are negative.      Objective:   Physical Exam        Assessment & Plan:

## 2018-05-22 ENCOUNTER — Other Ambulatory Visit: Payer: Self-pay | Admitting: Internal Medicine

## 2018-06-02 ENCOUNTER — Ambulatory Visit (INDEPENDENT_AMBULATORY_CARE_PROVIDER_SITE_OTHER): Payer: Medicare Other | Admitting: *Deleted

## 2018-06-02 VITALS — BP 138/78 | HR 63 | Resp 17 | Ht 67.0 in | Wt 162.0 lb

## 2018-06-02 DIAGNOSIS — Z0000 Encounter for general adult medical examination without abnormal findings: Secondary | ICD-10-CM | POA: Diagnosis not present

## 2018-06-02 MED ORDER — LOSARTAN POTASSIUM 100 MG PO TABS: 100.0000 mg | ORAL_TABLET | Freq: Every day | ORAL | 3 refills | Status: DC

## 2018-06-02 MED ORDER — ALENDRONATE SODIUM 70 MG PO TABS: 70.0000 mg | ORAL_TABLET | ORAL | 3 refills | Status: AC

## 2018-06-02 MED ORDER — ATORVASTATIN CALCIUM 20 MG PO TABS: 20.0000 mg | ORAL_TABLET | Freq: Every day | ORAL | 3 refills | Status: DC

## 2018-06-02 MED ORDER — TRIAMTERENE-HCTZ 37.5-25 MG PO TABS: 0.5000 | ORAL_TABLET | Freq: Every day | ORAL | 3 refills | Status: AC

## 2018-06-02 NOTE — Patient Instructions (Addendum)
Continue doing brain stimulating activities (puzzles, reading, adult coloring books, staying active) to keep memory sharp.   Continue to eat heart healthy diet (full of fruits, vegetables, whole grains, lean protein, water--limit salt, fat, and sugar intake) and increase physical activity as tolerated.  Health Maintenance, Female Adopting a healthy lifestyle and getting preventive care can go a long way to promote health and wellness. Talk with your health care provider about what schedule of regular examinations is right for you. This is a good chance for you to check in with your provider about disease prevention and staying healthy. In between checkups, there are plenty of things you can do on your own. Experts have done a lot of research about which lifestyle changes and preventive measures are most likely to keep you healthy. Ask your health care provider for more information. Weight and diet Eat a healthy diet  Be sure to include plenty of vegetables, fruits, low-fat dairy products, and lean protein.  Do not eat a lot of foods high in solid fats, added sugars, or salt.  Get regular exercise. This is one of the most important things you can do for your health. ? Most adults should exercise for at least 150 minutes each week. The exercise should increase your heart rate and make you sweat (moderate-intensity exercise). ? Most adults should also do strengthening exercises at least twice a week. This is in addition to the moderate-intensity exercise.  Maintain a healthy weight  Body mass index (BMI) is a measurement that can be used to identify possible weight problems. It estimates body fat based on height and weight. Your health care provider can help determine your BMI and help you achieve or maintain a healthy weight.  For females 20 years of age and older: ? A BMI below 18.5 is considered underweight. ? A BMI of 18.5 to 24.9 is normal. ? A BMI of 25 to 29.9 is considered  overweight. ? A BMI of 30 and above is considered obese.  Watch levels of cholesterol and blood lipids  You should start having your blood tested for lipids and cholesterol at 82 years of age, then have this test every 5 years.  You may need to have your cholesterol levels checked more often if: ? Your lipid or cholesterol levels are high. ? You are older than 82 years of age. ? You are at high risk for heart disease.  Cancer screening Lung Cancer  Lung cancer screening is recommended for adults 55-80 years old who are at high risk for lung cancer because of a history of smoking.  A yearly low-dose CT scan of the lungs is recommended for people who: ? Currently smoke. ? Have quit within the past 15 years. ? Have at least a 30-pack-year history of smoking. A pack year is smoking an average of one pack of cigarettes a day for 1 year.  Yearly screening should continue until it has been 15 years since you quit.  Yearly screening should stop if you develop a health problem that would prevent you from having lung cancer treatment.  Breast Cancer  Practice breast self-awareness. This means understanding how your breasts normally appear and feel.  It also means doing regular breast self-exams. Let your health care provider know about any changes, no matter how small.  If you are in your 20s or 30s, you should have a clinical breast exam (CBE) by a health care provider every 1-3 years as part of a regular health exam.  If   If you are 40 or older, have a CBE every year. Also consider having a breast X-ray (mammogram) every year.  If you have a family history of breast cancer, talk to your health care provider about genetic screening.  If you are at high risk for breast cancer, talk to your health care provider about having an MRI and a mammogram every year.  Breast cancer gene (BRCA) assessment is recommended for women who have family members with BRCA-related cancers. BRCA-related cancers  include: ? Breast. ? Ovarian. ? Tubal. ? Peritoneal cancers.  Results of the assessment will determine the need for genetic counseling and BRCA1 and BRCA2 testing.  Cervical Cancer Your health care provider may recommend that you be screened regularly for cancer of the pelvic organs (ovaries, uterus, and vagina). This screening involves a pelvic examination, including checking for microscopic changes to the surface of your cervix (Pap test). You may be encouraged to have this screening done every 3 years, beginning at age 21.  For women ages 30-65, health care providers may recommend pelvic exams and Pap testing every 3 years, or they may recommend the Pap and pelvic exam, combined with testing for human papilloma virus (HPV), every 5 years. Some types of HPV increase your risk of cervical cancer. Testing for HPV may also be done on women of any age with unclear Pap test results.  Other health care providers may not recommend any screening for nonpregnant women who are considered low risk for pelvic cancer and who do not have symptoms. Ask your health care provider if a screening pelvic exam is right for you.  If you have had past treatment for cervical cancer or a condition that could lead to cancer, you need Pap tests and screening for cancer for at least 20 years after your treatment. If Pap tests have been discontinued, your risk factors (such as having a new sexual partner) need to be reassessed to determine if screening should resume. Some women have medical problems that increase the chance of getting cervical cancer. In these cases, your health care provider may recommend more frequent screening and Pap tests.  Colorectal Cancer  This type of cancer can be detected and often prevented.  Routine colorectal cancer screening usually begins at 82 years of age and continues through 82 years of age.  Your health care provider may recommend screening at an earlier age if you have risk factors  for colon cancer.  Your health care provider may also recommend using home test kits to check for hidden blood in the stool.  A small camera at the end of a tube can be used to examine your colon directly (sigmoidoscopy or colonoscopy). This is done to check for the earliest forms of colorectal cancer.  Routine screening usually begins at age 50.  Direct examination of the colon should be repeated every 5-10 years through 82 years of age. However, you may need to be screened more often if early forms of precancerous polyps or small growths are found.  Skin Cancer  Check your skin from head to toe regularly.  Tell your health care provider about any new moles or changes in moles, especially if there is a change in a mole's shape or color.  Also tell your health care provider if you have a mole that is larger than the size of a pencil eraser.  Always use sunscreen. Apply sunscreen liberally and repeatedly throughout the day.  Protect yourself by wearing long sleeves, pants, a wide-brimmed hat, and   sunglasses whenever you are outside.  Heart disease, diabetes, and high blood pressure  High blood pressure causes heart disease and increases the risk of stroke. High blood pressure is more likely to develop in: ? People who have blood pressure in the high end of the normal range (130-139/85-89 mm Hg). ? People who are overweight or obese. ? People who are African American.  If you are 25-70 years of age, have your blood pressure checked every 3-5 years. If you are 86 years of age or older, have your blood pressure checked every year. You should have your blood pressure measured twice-once when you are at a hospital or clinic, and once when you are not at a hospital or clinic. Record the average of the two measurements. To check your blood pressure when you are not at a hospital or clinic, you can use: ? An automated blood pressure machine at a pharmacy. ? A home blood pressure monitor.  If  you are between 34 years and 33 years old, ask your health care provider if you should take aspirin to prevent strokes.  Have regular diabetes screenings. This involves taking a blood sample to check your fasting blood sugar level. ? If you are at a normal weight and have a low risk for diabetes, have this test once every three years after 82 years of age. ? If you are overweight and have a high risk for diabetes, consider being tested at a younger age or more often. Preventing infection Hepatitis B  If you have a higher risk for hepatitis B, you should be screened for this virus. You are considered at high risk for hepatitis B if: ? You were born in a country where hepatitis B is common. Ask your health care provider which countries are considered high risk. ? Your parents were born in a high-risk country, and you have not been immunized against hepatitis B (hepatitis B vaccine). ? You have HIV or AIDS. ? You use needles to inject street drugs. ? You live with someone who has hepatitis B. ? You have had sex with someone who has hepatitis B. ? You get hemodialysis treatment. ? You take certain medicines for conditions, including cancer, organ transplantation, and autoimmune conditions.  Hepatitis C  Blood testing is recommended for: ? Everyone born from [birth year] through 1963. ? Anyone with known risk factors for hepatitis C.  Sexually transmitted infections (STIs)  You should be screened for sexually transmitted infections (STIs) including gonorrhea and chlamydia if: ? You are sexually active and are younger than 82 years of age. ? You are older than 82 years of age and your health care provider tells you that you are at risk for this type of infection. ? Your sexual activity has changed since you were last screened and you are at an increased risk for chlamydia or gonorrhea. Ask your health care provider if you are at risk.  If you do not have HIV, but are at risk, it may be recommended  that you take a prescription medicine daily to prevent HIV infection. This is called pre-exposure prophylaxis (PrEP). You are considered at risk if: ? You are sexually active and do not regularly use condoms or know the HIV status of your partner(s). ? You take drugs by injection. ? You are sexually active with a partner who has HIV.  Talk with your health care provider about whether you are at high risk of being infected with HIV. If you choose to begin PrEP, you  should first be tested for HIV. You should then be tested every 3 months for as long as you are taking PrEP. Pregnancy  If you are premenopausal and you may become pregnant, ask your health care provider about preconception counseling.  If you may become pregnant, take 400 to 800 micrograms (mcg) of folic acid every day.  If you want to prevent pregnancy, talk to your health care provider about birth control (contraception). Osteoporosis and menopause  Osteoporosis is a disease in which the bones lose minerals and strength with aging. This can result in serious bone fractures. Your risk for osteoporosis can be identified using a bone density scan.  If you are 19 years of age or older, or if you are at risk for osteoporosis and fractures, ask your health care provider if you should be screened.  Ask your health care provider whether you should take a calcium or vitamin D supplement to lower your risk for osteoporosis.  Menopause may have certain physical symptoms and risks.  Hormone replacement therapy may reduce some of these symptoms and risks. Talk to your health care provider about whether hormone replacement therapy is right for you. Follow these instructions at home:  Schedule regular health, dental, and eye exams.  Stay current with your immunizations.  Do not use any tobacco products including cigarettes, chewing tobacco, or electronic cigarettes.  If you are pregnant, do not drink alcohol.  If you are  breastfeeding, limit how much and how often you drink alcohol.  Limit alcohol intake to no more than 1 drink per day for nonpregnant women. One drink equals 12 ounces of beer, 5 ounces of Rakhi Romagnoli, or 1 ounces of hard liquor.  Do not use street drugs.  Do not share needles.  Ask your health care provider for help if you need support or information about quitting drugs.  Tell your health care provider if you often feel depressed.  Tell your health care provider if you have ever been abused or do not feel safe at home. This information is not intended to replace advice given to you by your health care provider. Make sure you discuss any questions you have with your health care provider. Document Released: 04/23/2011 Document Revised: 03/15/2016 Document Reviewed: 07/12/2015 Elsevier Interactive Patient Education  [employer].  It is important to avoid accidents which may result in broken bones.  Here are a few ideas on how to make your home safer so you will be less likely to trip or fall.  1. Use nonskid mats or non slip strips in your shower or tub, on your bathroom floor and around sinks.  If you know that you have spilled water, wipe it up! 2. In the bathroom, it is important to have properly installed grab bars on the walls or on the edge of the tub.  Towel racks are NOT strong enough for you to hold onto or to pull on for support. 3. Stairs and hallways should have enough light.  Add lamps or night lights if you need ore light. 4. It is good to have handrails on both sides of the stairs if possible.  Always fix broken handrails right away. 5. It is important to see the edges of steps.  Paint the edges of outdoor steps white so you can see them better.  Put colored tape on the edge of inside steps. 6. Throw-rugs are dangerous because they can slide.  Removing the rugs is the best idea, but if they must stay, add  adhesive carpet tape to prevent slipping. 7. Do not keep things on stairs  or in the halls.  Remove small furniture that blocks the halls as it may cause you to trip.  Keep telephone and electrical cords out of the way where you walk. 8. Always were sturdy, rubber-soled shoes for good support.  Never wear just socks, especially on the stairs.  Socks may cause you to slip or fall.  Do not wear full-length housecoats as you can easily trip on the bottom.  9. Place the things you use the most on the shelves that are the easiest to reach.  If you use a stepstool, make sure it is in good condition.  If you feel unsteady, DO NOT climb, ask for help. 10. If a health professional advises you to use a cane or walker, do not be ashamed.  These items can keep you from falling and breaking your bones.

## 2018-06-02 NOTE — Progress Notes (Addendum)
Subjective:   Madison Bradley is a 84 y.o. female who presents for Medicare Annual (Subsequent) preventive examination.  Review of Systems:  No ROS.  Medicare Wellness Visit. Additional risk factors are reflected in the social history.  Cardiac Risk Factors include: advanced age (>18men, >45 women);dyslipidemia;hypertension Sleep patterns: gets up 1 times nightly to void and sleeps 7-8 hours nightly.    Home Safety/Smoke Alarms: Feels safe in home. Smoke alarms in place.  Living environment; residence and Firearm Safety: 2-story house, no firearms. Lives with husband, no needs for DME, good support system Seat Belt Safety/Bike Helmet: Wears seat belt.      Objective:     Vitals: BP 138/78   Pulse 63   Resp 17   Ht 5\' 7"  (1.702 m)   Wt 162 lb (73.5 kg)   SpO2 98%   BMI 25.37 kg/m   Body mass index is 25.37 kg/m.  Advanced Directives 06/02/2018  Does Patient Have a Medical Advance Directive? No  Would patient like information on creating a medical advance directive? Yes (ED - Information included in AVS)    Tobacco Social History   Tobacco Use  Smoking Status Never Smoker  Smokeless Tobacco Never Used     Counseling given: Not Answered  Past Medical History:  Diagnosis Date  . Colon polyps 2009  . Family hx of colon cancer   . Hyperlipidemia   . Hypertension   . IBS (irritable bowel syndrome)    Past Surgical History:  Procedure Laterality Date  . CARPAL TUNNEL RELEASE    . CATARACT EXTRACTION, BILATERAL     SE Ophth  . colonoscopy with polypectomy     Dr Sammuel Cooper , last 2009  . KNEE ARTHROSCOPY    . MASTOIDECTOMY     L @ 74 mos old  . TONSILLECTOMY AND ADENOIDECTOMY     Family History  Problem Relation Age of Onset  . Stroke Mother 39       cerebral hemorrhage   . Colon cancer Sister        2 sisters  . Heart attack Sister        in late 23s  . Cancer Daughter        Hodgkins Lymphoma  . Cancer Son        NHL, Mantle cell  . Stomach cancer  Sister   . Diabetes Neg Hx    Social History   Socioeconomic History  . Marital status: Married    Spouse name: Not on file  . Number of children: 6  . Years of education: Not on file  . Highest education level: Not on file  Occupational History  . Not on file  Social Needs  . Financial resource strain: Not hard at all  . Food insecurity:    Worry: Never true    Inability: Never true  . Transportation needs:    Medical: No    Non-medical: No  Tobacco Use  . Smoking status: Never Smoker  . Smokeless tobacco: Never Used  Substance and Sexual Activity  . Alcohol use: Yes    Comment: Wine , < 4 glasses / week  . Drug use: No  . Sexual activity: Not Currently  Lifestyle  . Physical activity:    Days per week: 2 days    Minutes per session: 30 min  . Stress: Not at all  Relationships  . Social connections:    Talks on phone: More than three times a week  Gets together: More than three times a week    Attends religious service: Never    Active member of club or organization: Yes    Attends meetings of clubs or organizations: Never    Relationship status: Married  Other Topics Concern  . Not on file  Social History Narrative  . Not on file    Outpatient Encounter Medications as of 06/02/2018  Medication Sig  . alendronate (FOSAMAX) 70 MG tablet Take 1 tablet (70 mg total) by mouth once a week.  Marland Kitchen aspirin 81 MG tablet Take 81 mg by mouth daily.   Marland Kitchen atorvastatin (LIPITOR) 20 MG tablet Take 1 tablet (20 mg total) by mouth daily.  Marland Kitchen losartan (COZAAR) 100 MG tablet Take 1 tablet (100 mg total) by mouth daily.  . metoprolol succinate (TOPROL-XL) 50 MG 24 hr tablet TAKE 1 TABLET DAILY  . triamterene-hydrochlorothiazide (MAXZIDE-25) 37.5-25 MG tablet Take 0.5 tablets by mouth daily.  . [DISCONTINUED] alendronate (FOSAMAX) 70 MG tablet Take 1 tablet (70 mg total) by mouth once a week.  . [DISCONTINUED] atorvastatin (LIPITOR) 20 MG tablet Take 1 tablet (20 mg total) by mouth  daily.  . [DISCONTINUED] losartan (COZAAR) 100 MG tablet Take 1 tablet (100 mg total) by mouth daily.  . [DISCONTINUED] triamterene-hydrochlorothiazide (MAXZIDE-25) 37.5-25 MG tablet Take 0.5 tablets by mouth daily.   No facility-administered encounter medications on file as of 06/02/2018.     Activities of Daily Living In your present state of health, do you have any difficulty performing the following activities: 06/02/2018  Hearing? N  Vision? N  Difficulty concentrating or making decisions? N  Walking or climbing stairs? N  Dressing or bathing? N  Doing errands, shopping? N  Preparing Food and eating ? N  Using the Toilet? N  In the past six months, have you accidently leaked urine? N  Do you have problems with loss of bowel control? N  Managing your Medications? N  Managing your Finances? N  Housekeeping or managing your Housekeeping? N  Some recent data might be hidden    Patient Care Team: Biagio Borg, MD as PCP - General (Internal Medicine)    Assessment:   This is a routine wellness examination for [place]. Physical assessment deferred to PCP.   Exercise Activities and Dietary recommendations Current Exercise Habits: Home exercise routine, Type of exercise: walking, Time (Minutes): 30, Frequency (Times/Week): 3, Weekly Exercise (Minutes/Week): 90, Intensity: Mild, Exercise limited by: orthopedic condition(s) Diet (meal preparation, eat out, water intake, caffeinated beverages, dairy products, fruits and vegetables): in general, a "healthy" diet  , well balanced   Reviewed heart healthy diet. Encouraged patient to increase daily water and healthy fluid intake.  Goals    . Patient Stated     Continue to eat healthy, stay physically and socially active. Enjoy life and family.       Fall Risk Fall Risk  03/12/2018 03/08/2017 03/01/2016 02/14/2015 02/05/2013  Falls in the past year? No No No No No    Depression Screen PHQ 2/9 Scores 06/02/2018 03/12/2018 03/08/2017  03/01/2016  PHQ - 2 Score 0 0 0 0  PHQ- 9 Score - 0 - -     Cognitive Function MMSE - Mini Mental State Exam 06/02/2018  Orientation to time 5  Orientation to Place 5  Registration 3  Attention/ Calculation 5  Recall 2  Language- name 2 objects 2  Language- repeat 1  Language- follow 3 step command 3  Language- read & follow direction 1  Write a sentence 1  Copy design 1  Total score 29        Immunization History  Administered Date(s) Administered  . Influenza Whole 09/23/2009  . Influenza-Unspecified 09/21/2013  . Pneumococcal Conjugate-13 03/12/2018  . Pneumococcal Polysaccharide-23 01/28/2012  . Tdap 03/08/2017   Screening Tests Health Maintenance  Topic Date Due  . INFLUENZA VACCINE  02/01/2019 (Originally 05/22/2018)  . TETANUS/TDAP  03/09/2027  . DEXA SCAN  Completed  . PNA vac Low Risk Adult  Completed      Plan:      Continue doing brain stimulating activities (puzzles, reading, adult coloring books, staying active) to keep memory sharp.   Continue to eat heart healthy diet (full of fruits, vegetables, whole grains, lean protein, water--limit salt, fat, and sugar intake) and increase physical activity as tolerated.  I have personally reviewed and noted the following in the patient's chart:   . Medical and social history . Use of alcohol, tobacco or illicit drugs  . Current medications and supplements . Functional ability and status . Nutritional status . Physical activity . Advanced directives . List of other physicians . Vitals . Screenings to include cognitive, depression, and falls . Referrals and appointments  In addition, I have reviewed and discussed with patient certain preventive protocols, quality metrics, and best practice recommendations. A written personalized care plan for preventive services as well as general preventive health recommendations were provided to patient.     Michiel Cowboy, RN  06/02/2018    Medical screening  examination/treatment/procedure(s) were performed by non-physician practitioner and as supervising physician I was immediately available for consultation/collaboration. I agree with above. Cathlean Cower, MD

## 2018-06-20 ENCOUNTER — Telehealth: Payer: Self-pay | Admitting: Internal Medicine

## 2018-06-20 MED ORDER — ATORVASTATIN CALCIUM 20 MG PO TABS: 20.0000 mg | ORAL_TABLET | Freq: Every day | ORAL | 0 refills | Status: AC

## 2018-06-20 MED ORDER — LOSARTAN POTASSIUM 100 MG PO TABS: 100.0000 mg | ORAL_TABLET | Freq: Every day | ORAL | 0 refills | Status: AC

## 2018-06-20 MED ORDER — METOPROLOL SUCCINATE ER 50 MG PO TB24: 50.0000 mg | ORAL_TABLET | Freq: Every day | ORAL | 0 refills | Status: AC

## 2018-06-20 NOTE — Telephone Encounter (Signed)
Copied from [place] [id number]. Topic: Quick Communication - See Telephone Encounter >> Jun 20, 2018  4:15 PM Gardiner Ramus wrote: CRM for notification. See Telephone encounter for: 06/20/18.losartan (COZAAR) 100 MG tablet [[id number]]atorvastatin (LIPITOR) 20 MG tablet [[id number]]alendronate (FOSAMAX) 70 MG tablet [[id number]]  pt called and stated that she needs 7 day supply because [facility] is running late on prescription. Please advise

## 2018-06-20 NOTE — Telephone Encounter (Signed)
done

## 2018-07-11 ENCOUNTER — Ambulatory Visit: Payer: Medicare Other | Admitting: Podiatry

## 2018-08-05 ENCOUNTER — Ambulatory Visit (INDEPENDENT_AMBULATORY_CARE_PROVIDER_SITE_OTHER): Payer: Medicare Other

## 2018-08-05 DIAGNOSIS — Z23 Encounter for immunization: Secondary | ICD-10-CM

## 2018-08-06 ENCOUNTER — Ambulatory Visit: Payer: Medicare Other

## 2018-08-08 ENCOUNTER — Ambulatory Visit (INDEPENDENT_AMBULATORY_CARE_PROVIDER_SITE_OTHER): Payer: Medicare Other | Admitting: Podiatry

## 2018-08-08 ENCOUNTER — Encounter: Payer: Self-pay | Admitting: Podiatry

## 2018-08-08 DIAGNOSIS — M79675 Pain in left toe(s): Secondary | ICD-10-CM | POA: Diagnosis not present

## 2018-08-08 DIAGNOSIS — M79674 Pain in right toe(s): Secondary | ICD-10-CM

## 2018-08-08 DIAGNOSIS — B351 Tinea unguium: Secondary | ICD-10-CM

## 2018-08-11 ENCOUNTER — Encounter: Payer: Self-pay | Admitting: Podiatry

## 2018-08-17 NOTE — Progress Notes (Signed)
Subjective: Madison Bradley presents today for follow up of painful mycotic toenails. Resolution of pain symptoms are achieved  with periodic professional debridement.  Objective: Vascular Examination: Capillary refill time <3 seconds x 10 digits Dorsalis pedis and posterior tibial pulses present b/l No digital hair x 10 digits Skin temperature warm to warm b/l  Dermatological Examination: Skin with normal turgor, texture and tone b/l Toenails 1-5 b/l discolored, thick, dystrophic with subungual debris and pain with palpation to nailbeds due to thickness of nails.  Musculoskeletal: Muscle strength 5/5 to all LE muscle groups  Neurological: Sensation intact with 10 gram monofilament. Vibratory sensation intact.  Assessment: Painful onychomycosis toenails 1-5 b/l   Plan: 1. Toenails 1-5 b/l were debrided in length and girth without iatrogenic bleeding. 2. Patient to continue soft, supportive shoe gear 3. Patient to report any pedal injuries to medical professional immediately. 4. Follow up 3 months. Patient/POA to call should there be a concern in the interim.

## 2018-09-12 ENCOUNTER — Ambulatory Visit: Payer: Medicare Other | Admitting: Internal Medicine

## 2018-09-12 ENCOUNTER — Encounter: Payer: Self-pay | Admitting: Internal Medicine

## 2018-09-12 ENCOUNTER — Other Ambulatory Visit (INDEPENDENT_AMBULATORY_CARE_PROVIDER_SITE_OTHER): Payer: Medicare Other

## 2018-09-12 VITALS — BP 144/84 | HR 61 | Temp 97.9°F | Ht 67.0 in | Wt 167.0 lb

## 2018-09-12 DIAGNOSIS — E785 Hyperlipidemia, unspecified: Secondary | ICD-10-CM | POA: Diagnosis not present

## 2018-09-12 DIAGNOSIS — I10 Essential (primary) hypertension: Secondary | ICD-10-CM

## 2018-09-12 DIAGNOSIS — N183 Chronic kidney disease, stage 3 unspecified: Secondary | ICD-10-CM

## 2018-09-12 LAB — BASIC METABOLIC PANEL
BUN: 28 mg/dL — ABNORMAL HIGH (ref 6–23)
CO2: 30 mEq/L (ref 19–32)
Calcium: 9.1 mg/dL (ref 8.4–10.5)
Chloride: 103 mEq/L (ref 96–112)
Creatinine, Ser: 1.01 mg/dL (ref 0.40–1.20)
GFR: 54.66 mL/min — AB (ref >60.00)
Glucose, Bld: 88 mg/dL (ref 70–99)
POTASSIUM: 4.5 meq/L (ref 3.5–5.1)
SODIUM: 138 meq/L (ref 135–145)

## 2018-09-12 NOTE — Patient Instructions (Signed)

## 2018-09-12 NOTE — Progress Notes (Signed)
Subjective:    Patient ID: Madison Bradley, female    DOB: February 06, 1926, 84 y.o.   MRN: [id number]  HPI  Here for yearly f/u;  Overall doing ok;  Pt denies Chest pain, worsening SOB, DOE, wheezing, orthopnea, PND, worsening LE edema, palpitations, dizziness or syncope.  Pt denies neurological change such as new headache, facial or extremity weakness.  Pt denies polydipsia, polyuria, or low sugar symptoms. Pt states overall good compliance with treatment and medications, good tolerability, and has been trying to follow appropriate diet.  Pt denies worsening depressive symptoms, suicidal ideation or panic. No fever, night sweats, wt loss, loss of appetite, or other constitutional symptoms.  Pt states good ability with ADL's, has low fall risk, home safety reviewed and adequate, no other significant changes in hearing or vision, and not active with exercise.  No new complaints BP Readings from Last 3 Encounters:  09/12/18 (!) 144/84  06/02/18 138/78  04/02/18 (!) 150/90   Wt Readings from Last 3 Encounters:  09/12/18 167 lb (75.8 kg)  06/02/18 162 lb (73.5 kg)  03/12/18 163 lb (73.9 kg)   Past Medical History:  Diagnosis Date  . Colon polyps 2009  . Family hx of colon cancer   . Hyperlipidemia   . Hypertension   . IBS (irritable bowel syndrome)    Past Surgical History:  Procedure Laterality Date  . CARPAL TUNNEL RELEASE    . CATARACT EXTRACTION, BILATERAL     SE Ophth  . colonoscopy with polypectomy     Dr Sammuel Cooper , last 2009  . KNEE ARTHROSCOPY    . MASTOIDECTOMY     L @ 57 mos old  . TONSILLECTOMY AND ADENOIDECTOMY      reports that she has never smoked. She has never used smokeless tobacco. She reports that she drinks alcohol. She reports that she does not use drugs. family history includes Cancer in her daughter and son; Colon cancer in her sister; Heart attack in her sister; Stomach cancer in her sister; Stroke (age of onset: 65) in her mother. No Known Allergies Current  Outpatient Medications on File Prior to Visit  Medication Sig Dispense Refill  . alendronate (FOSAMAX) 70 MG tablet Take 1 tablet (70 mg total) by mouth once a week. 12 tablet 3  . aspirin 81 MG tablet Take 81 mg by mouth daily.     Marland Kitchen atorvastatin (LIPITOR) 20 MG tablet Take 1 tablet (20 mg total) by mouth daily. 7 tablet 0  . losartan (COZAAR) 100 MG tablet Take 1 tablet (100 mg total) by mouth daily. 7 tablet 0  . metoprolol succinate (TOPROL-XL) 50 MG 24 hr tablet Take 1 tablet (50 mg total) by mouth daily. Take with or immediately following a meal. 7 tablet 0  . triamterene-hydrochlorothiazide (MAXZIDE-25) 37.5-25 MG tablet Take 0.5 tablets by mouth daily. 45 tablet 3   No current facility-administered medications on file prior to visit.    Review of Systems Constitutional: Negative for other unusual diaphoresis, sweats, appetite or weight changes HENT: Negative for other worsening hearing loss, ear pain, facial swelling, mouth sores or neck stiffness.   Eyes: Negative for other worsening pain, redness or other visual disturbance.  Respiratory: Negative for other stridor or swelling Cardiovascular: Negative for other palpitations or other chest pain  Gastrointestinal: Negative for worsening diarrhea or loose stools, blood in stool, distention or other pain Genitourinary: Negative for hematuria, flank pain or other change in urine volume.  Musculoskeletal: Negative for myalgias or other joint  swelling.  Skin: Negative for other color change, or other wound or worsening drainage.  Neurological: Negative for other syncope or numbness. Hematological: Negative for other adenopathy or swelling Psychiatric/Behavioral: Negative for hallucinations, other worsening agitation, SI, self-injury, or new decreased concentration All other system neg per pt    Objective:   Physical Exam BP (!) 144/84   Pulse 61   Temp 97.9 F (36.6 C) (Oral)   Ht 5\' 7"  (1.702 m)   Wt 167 lb (75.8 kg)   SpO2 96%    BMI 26.16 kg/m  VS noted,  Constitutional: Pt is oriented to person, place, and time. Appears well-developed and well-nourished, in no significant distress and comfortable Head: Normocephalic and atraumatic  Eyes: Conjunctivae and EOM are normal. Pupils are equal, round, and reactive to light Right Ear: External ear normal without discharge Left Ear: External ear normal without discharge Nose: Nose without discharge or deformity Mouth/Throat: Oropharynx is without other ulcerations and moist  Neck: Normal range of motion. Neck supple. No JVD present. No tracheal deviation present or significant neck LA or mass Cardiovascular: Normal rate, regular rhythm, normal heart sounds and intact distal pulses.   Pulmonary/Chest: WOB normal and breath sounds without rales or wheezing  Abdominal: Soft. Bowel sounds are normal. NT. No HSM  Musculoskeletal: Normal range of motion. Exhibits no edema Lymphadenopathy: Has no other cervical adenopathy.  Neurological: Pt is alert and oriented to person, place, and time. Pt has normal reflexes. No cranial nerve deficit. Motor grossly intact, Gait intact Skin: Skin is warm and dry. No rash noted or new ulcerations Psychiatric:  Has normal mood and affect. Behavior is normal without agitation No other exam findings  Lab Results  Component Value Date   WBC 5.1 03/12/2018   HGB 13.1 03/12/2018   HCT 38.9 03/12/2018   PLT 260.0 03/12/2018   GLUCOSE 61 (L) 03/12/2018   CHOL 175 03/12/2018   TRIG 85.0 03/12/2018   HDL 54.90 03/12/2018   LDLDIRECT 116.9 09/19/2009   LDLCALC 103 (H) 03/12/2018   ALT 9 03/12/2018   AST 11 03/12/2018   NA 139 03/12/2018   K 4.5 03/12/2018   CL 104 03/12/2018   CREATININE 1.14 03/12/2018   BUN 28 (H) 03/12/2018   CO2 30 03/12/2018   TSH 2.84 03/12/2018   INR 1.0 11/20/2008       Assessment & Plan:

## 2018-09-13 NOTE — Assessment & Plan Note (Signed)
stable overall by history and exam, recent data reviewed with pt, and pt to continue medical treatment as before,  to f/u any worsening symptoms or concerns  

## 2018-12-12 ENCOUNTER — Ambulatory Visit: Payer: Medicare Other | Admitting: Podiatry

## 2019-01-31 ENCOUNTER — Emergency Department (HOSPITAL_COMMUNITY)
Admission: EM | Admit: 2019-01-31 | Discharge: 2019-02-20 | Disposition: E | Payer: Medicare Other | Attending: Emergency Medicine | Admitting: Emergency Medicine

## 2019-01-31 ENCOUNTER — Telehealth: Payer: Self-pay | Admitting: Internal Medicine

## 2019-01-31 ENCOUNTER — Telehealth: Payer: Self-pay | Admitting: Family Medicine

## 2019-01-31 ENCOUNTER — Other Ambulatory Visit: Payer: Self-pay

## 2019-01-31 ENCOUNTER — Encounter (HOSPITAL_COMMUNITY): Payer: Self-pay

## 2019-01-31 ENCOUNTER — Telehealth: Payer: Self-pay | Admitting: Nurse Practitioner

## 2019-01-31 DIAGNOSIS — I469 Cardiac arrest, cause unspecified: Secondary | ICD-10-CM | POA: Insufficient documentation

## 2019-01-31 DIAGNOSIS — R0789 Other chest pain: Secondary | ICD-10-CM | POA: Diagnosis present

## 2019-01-31 LAB — CBG MONITORING, ED: Glucose-Capillary: 88 mg/dL (ref 70–99)

## 2019-01-31 MED ORDER — DEXTROSE 50 % IV SOLN
INTRAVENOUS | Status: AC | PRN
  Administered 2019-01-31: 1 via INTRAVENOUS

## 2019-01-31 MED ORDER — SODIUM BICARBONATE 8.4 % IV SOLN
INTRAVENOUS | Status: AC | PRN
  Administered 2019-01-31: 25 meq via INTRAVENOUS

## 2019-01-31 MED ORDER — EPINEPHRINE 1 MG/10ML IJ SOSY
PREFILLED_SYRINGE | INTRAMUSCULAR | Status: AC | PRN
  Administered 2019-01-31 (×5): 1 mg via INTRAVENOUS

## 2019-01-31 MED ORDER — CALCIUM CHLORIDE 10 % IV SOLN
INTRAVENOUS | Status: AC | PRN
  Administered 2019-01-31: 1 g via INTRAVENOUS

## 2019-02-02 ENCOUNTER — Telehealth: Payer: Self-pay | Admitting: Internal Medicine

## 2019-02-02 NOTE — Telephone Encounter (Incomplete)
02/02/19 received D/C from Richrd Humbles will forward it to Dr.James John at [facility] to signed. PWR

## 2019-02-03 ENCOUNTER — Telehealth: Payer: Self-pay

## 2019-02-03 NOTE — Telephone Encounter (Signed)
Received dc back from Doctor Jenny Reichmann. Called funeral home to let them know dc was ready for pickup.

## 2019-02-20 NOTE — Code Documentation (Signed)
No pulses , no heart movement

## 2019-02-20 NOTE — Code Documentation (Signed)
Ultrasound and doppler at bedside being used by MD to check for pulses

## 2019-02-20 NOTE — Code Documentation (Signed)
No pulse , cpr continued

## 2019-02-20 NOTE — Code Documentation (Signed)
No pulse cpr continued

## 2019-02-20 NOTE — Code Documentation (Signed)
No pulse , pea , CPR continued

## 2019-02-20 NOTE — ED Provider Notes (Addendum)
[facility] EMERGENCY DEPARTMENT Provider Note   CSN: [id number] Arrival date & time: 02-13-19  1310    History   Chief Complaint Chief Complaint  Patient presents with   Chest Pain   Cardiac Arrest    HPI ETHELWYN GILBERTSON is a 85 y.o. female.     LEVEL 5 caveat due to cardiac arrest  The history is provided by the EMS personnel.  Cardiac Arrest  Witnessed by:  Healthcare provider Incident location: In ED, patient arrived to triage with chest pain and lost pulses prior to being bedded. Patient was alert and feeling well. Had chest pain earlier in day and did not want to come with EMS.  Time before ALS initiated:  Immediate Pulse:  Absent Initial cardiac rhythm per EMS:  PEA Associated symptoms: chest pain     Past Medical History:  Diagnosis Date   Colon polyps 2009   Family hx of colon cancer    Hyperlipidemia    Hypertension    IBS (irritable bowel syndrome)     Patient Active Problem List   Diagnosis Date Noted   CKD (chronic kidney disease) stage 3, GFR 30-59 ml/min ([place]) 09/12/2018   Preventative health care 03/01/2016   Chronic venous insufficiency 03/01/2016   Dermatitis 03/01/2016   Family hx of colon cancer    Solitary pulmonary nodule 11/09/2013   Diverticulosis 01/28/2012   PERFORATED TYMPANIC MEMBRANE 02/14/2010   SYNCOPE 11/24/2008   Hyperlipidemia 11/08/2008   Essential hypertension 11/08/2008   Irritable bowel syndrome 11/08/2008   History of colonic polyps 11/08/2008    Past Surgical History:  Procedure Laterality Date   CARPAL TUNNEL RELEASE     CATARACT EXTRACTION, BILATERAL     SE Ophth   colonoscopy with polypectomy     Dr Sammuel Cooper , last 2009   KNEE ARTHROSCOPY     MASTOIDECTOMY     L @ 66 mos old   [place]       OB History   No obstetric history on file.      Home Medications    Prior to Admission medications   Medication Sig Start Date End Date  Taking? Authorizing Provider  alendronate (FOSAMAX) 70 MG tablet Take 1 tablet (70 mg total) by mouth once a week. 06/02/18   Biagio Borg, MD  aspirin 81 MG tablet Take 81 mg by mouth daily.     [provider]  atorvastatin (LIPITOR) 20 MG tablet Take 1 tablet (20 mg total) by mouth daily. 06/20/18   Biagio Borg, MD  losartan (COZAAR) 100 MG tablet Take 1 tablet (100 mg total) by mouth daily. 06/20/18   Biagio Borg, MD  metoprolol succinate (TOPROL-XL) 50 MG 24 hr tablet Take 1 tablet (50 mg total) by mouth daily. Take with or immediately following a meal. 06/20/18   Biagio Borg, MD  triamterene-hydrochlorothiazide (MAXZIDE-25) 37.5-25 MG tablet Take 0.5 tablets by mouth daily. 06/02/18   Biagio Borg, MD    Family History Family History  Problem Relation Age of Onset   Stroke Mother 36       cerebral hemorrhage    Colon cancer Sister        2 sisters   Heart attack Sister        in late 78s   Cancer Daughter        Hodgkins Lymphoma   Cancer Son        NHL, Mantle cell   Stomach cancer  Sister    Diabetes Neg Hx     Social History Social History   Tobacco Use   Smoking status: Never Smoker   Smokeless tobacco: Never Used  Substance Use Topics   Alcohol use: Yes    Comment: Wine , < 4 glasses / week   Drug use: No     Allergies   Patient has no known allergies.   Review of Systems Review of Systems  Unable to perform ROS: Acuity of condition  Cardiovascular: Positive for chest pain.     Physical Exam Updated Vital Signs  ED Triage Vitals  Enc Vitals Group     BP 26-Feb-2019 1339 (!) 73/22     Pulse Rate 2019-02-26 1346 (!) 0     Resp 02/26/19 1339 20     Temp --      Temp src --      SpO2 --      Weight Feb 26, 2019 1412 160 lb (72.6 kg)     Height 02-26-2019 1412 5\' 7"  (1.702 m)     Head Circumference --      Peak Flow --      Pain Score 02/26/2019 1314 0     Pain Loc --      Pain Edu? --      Excl. in [place]? --     Physical  Exam Constitutional:      General: She is in acute distress.     Appearance: She is ill-appearing.  HENT:     Head: Normocephalic and atraumatic.     Nose: Nose normal.     Mouth/Throat:     Mouth: Mucous membranes are moist.     Pharynx: No posterior oropharyngeal erythema.  Eyes:     General: Lids are normal.     Conjunctiva/sclera:     Right eye: Right conjunctiva is not injected.     Left eye: Left conjunctiva is not injected.  Neck:     Musculoskeletal: Neck supple. No neck rigidity.     Trachea: No tracheal deviation.  Cardiovascular:     Pulses: Decreased pulses.          Carotid pulses are 0 on the right side and 0 on the left side.    Heart sounds: Heart sounds not distant.  Pulmonary:     Effort: Respiratory distress present.     Breath sounds: Decreased breath sounds present.  Chest:     Chest wall: No deformity.  Abdominal:     General: There are no signs of injury.     Palpations: Abdomen is soft.  Musculoskeletal:        General: No swelling.     Right lower leg: No edema.     Left lower leg: No edema.  Skin:    General: Skin is warm.     Capillary Refill: Capillary refill takes less than 2 seconds.  Neurological:     GCS: GCS eye subscore is 1. GCS verbal subscore is 1. GCS motor subscore is 1.      ED Treatments / Results  Labs (all labs ordered are listed, but only abnormal results are displayed) Labs Reviewed  CBG MONITORING, ED    EKG EKG Interpretation  Date/Time:  02-26-2019 13:38:42 EDT Ventricular Rate:  45 PR Interval:    QRS Duration: 163 QT Interval:  443 QTC Calculation: 384 R Axis:   -67 Text Interpretation:  PEA Confirmed by Lennice Sites [phone]) on 26-Feb-2019 3:37:44 PM   Radiology  No results found.  Procedures .Critical Care Performed by: Lennice Sites, DO Authorized by: Lennice Sites, DO   Critical care provider statement:    Critical care time (minutes):  51   Critical care time was exclusive of:   Separately billable procedures and treating other patients and teaching time   Critical care was necessary to treat or prevent imminent or life-threatening deterioration of the following conditions:  Cardiac failure   Critical care was time spent personally by me on the following activities:  Blood draw for specimens, development of treatment plan with patient or surrogate, evaluation of patient's response to treatment, examination of patient, obtaining history from patient or surrogate, ordering and performing treatments and interventions, pulse oximetry, re-evaluation of patient's condition and review of old charts   I assumed direction of critical care for this patient from another provider in my specialty: no   Procedure Name: Intubation Date/Time: 02-23-19 3:39 PM Performed by: Lennice Sites, DO Oxygen Delivery Method: Ambu bag Preoxygenation: Pre-oxygenation with 100% oxygen Laryngoscope Size: Glidescope and 3 Grade View: Grade I Tube size: 7.0 mm Number of attempts: 1 Airway Equipment and Method: Patient positioned with wedge pillow and Stylet Placement Confirmation: ETT inserted through vocal cords under direct vision and Positive ETCO2 Secured at: 23 cm Difficulty Due To: Difficulty was unanticipated      (including critical care time)  Medications Ordered in ED Medications  EPINEPHrine (ADRENALIN) 1 MG/10ML injection (1 mg Intravenous Given 2019-02-23 1333)  calcium chloride injection (1 g Intravenous Given 02-23-19 1321)  dextrose 50 % solution (1 ampule Intravenous Given 2019-02-23 1328)  sodium bicarbonate injection (25 mEq Intravenous Given 02-23-19 1329)     Initial Impression / Assessment and Plan / ED Course  I have reviewed the triage vital signs and the nursing notes.  Pertinent labs & imaging results that were available during my care of the patient were reviewed by me and considered in my medical decision making (see chart for details).    CYMONE YESKE is a  85 year old female with history of hypertension, high cholesterol who presents to the ED with chest pain.  Patient went into cardiac arrest as she was being placed in a room after she was triaged.  Patient had about 30 minutes of chest pain last night.  EMS came out to the house and noted a possible elevation in V6 but she refused transport at that time.  Patient woke up this morning and felt lightheaded.  Continued to have some chest pain as well.  She denied any infectious symptoms to triage nurse.  She did not feel any shortness of breath.  Patient then stated that she felt dizzy and passed out and went pulseless.  CPR was initiated.  Patient was successfully intubated.  After about 30 to 40 minutes of CPR with several rounds of epinephrine, calcium, dextrose, sodium bicarb, transcutaneous pacing, return of spontaneous circulation was not achieved.  Patient had normal blood sugar.  Had good oxygenation throughout CPR.  Was intubated without any issues.  It appeared that she may have had ST elevations on EKG that was done during the code.  Suspect that she had myocardial infarction that led to this cardiac arrest today as she was having chest pain intermittently over the last day.  Had history of high cholesterol hypertension.  Patient was pronounced dead at 1346.  PCP was made aware and will fill out the certificate.  Death certificate to be faxed to Dr. Cathlean Cower.  Family was made aware,  chaplain aware. Medical examiner not called as appears natural death. No concern for trauma.  This chart was dictated using voice recognition software.  Despite best efforts to proofread,  errors can occur which can change the documentation meaning.    Final Clinical Impressions(s) / ED Diagnoses   Final diagnoses:  Cardiac arrest [facility])    ED Discharge Orders    None       Lennice Sites, DO 02-10-2019 [place], [place], DO 02/10/19 1655

## 2019-02-20 NOTE — Code Documentation (Signed)
No pulse , pea, CPR continued

## 2019-02-20 NOTE — Telephone Encounter (Addendum)
I received a call from the answering service and promptly called the patient's daughter back at 12:06 PM.  We spoke for 18 minutes.  The patient experienced an episode of chest pain last evening and mentioned it to her daughter today.  Her daughter is a Industrial/product designer of Kathrynn Humble, a Designer, jewellery in our practice.  Lori advised contacting EMS.  When EMS arrived the patient was chest pain-free but had concerning ST elevations in V6, no other leads had ST elevations per EMS report and posterior leads were placed and were felt to be unremarkable.  Of note I have not been provided the strips for review.  The patient was complaining of chest pain and dizziness yesterday evening, and had an unpleasant night of sleep due to restlessness.  When EMS arrived today her blood pressure was 120/70 mmHg and pulse of 80 per her daughter's report.  The daughter reports that EMS had recommended the patient come in for an evaluation and undergo serial cardiac biomarkers.  The daughter understood this to me and going to urgent care, and she contacted an urgent care who stated that they do not perform that test in a point-of-care fashion and recommended presentation to the ED.  At this point the patient's daughter called the answering service and I promptly returned the phone call.  I recommended to the daughter that the patient present right away to the emergency department for a repeat ECG to evaluate for evolving ST changes and a troponin.  If the troponin was negative, this would be somewhat reassuring since the chest pain occurred last night.  The daughter states that the patient may have also had an episode of chest pain last week that she is learning about today.  The patient's daughter asked me several times if my recommendation was presentation to the ED and I told her yes unequivocally.  The patient's concern of course is exposure to coronavirus.  I have told the patient's daughter that if it is within the patient's  wishes to have a cardiovascular evaluation and is in line with her goals of care, I would recommend presentation to the ED.  She thanked me for the call, and plans to speak to her mother regarding this recommendation.

## 2019-02-20 NOTE — Code Documentation (Signed)
Patient time of death occurred at 1346 called by Dr.curatolo

## 2019-02-20 NOTE — Telephone Encounter (Signed)
Was stopped while I was outside by patient's daughter Madison Bradley. I am the provider for Madison Bradley. Madison Bradley was called due to her mother having chest tightness all last night and was still in the bed. Apparently had a spell of this last week as well. Very fatigued as well. Madison Bradley was planning on calling EMS to get her checked over.    EMS was noted to be at that house.   Madison Bradley has sent me an update via text that EKG showed ST elevation in V6 only. "Posterior EKG is clean" BP was 120/70, sugar good. Temp 98.1. EMS told her to go to Urgent Care for a troponin test.    Madison Bradley gave me written permission to access the chart - Madison Bradley is a patient of Dr. Tamala Julian - has not been seen in 5 years. I have advised that Madison Bradley call the physician with [facility] on call for further disposition.   Burtis Junes, RN, [place] [address] [place], [place]  [phone]

## 2019-02-20 NOTE — Code Documentation (Signed)
No pulse , PEA , cpr continued

## 2019-02-20 NOTE — Code Documentation (Signed)
Pt intubated at this time.   

## 2019-02-20 NOTE — Telephone Encounter (Signed)
Received call from [place] stating patient unfortunately passed away and needs death certificate completed.  ED physician states that it appears she had massive MI.  Please see cardiology telephone encounter and note from ED from today.  Death certificate to be forwarded to Dr. Jenny Reichmann.

## 2019-02-20 NOTE — Code Documentation (Signed)
CPR CONTINUED < NO Pulse

## 2019-02-20 NOTE — Telephone Encounter (Signed)
Thanks, this is noted.

## 2019-02-20 NOTE — Code Documentation (Signed)
No pulse continue cpr

## 2019-02-20 NOTE — ED Notes (Addendum)
Pt from home with daughter after having a 30 minutes episode of chest pain last night. Pt states that after she ate she felt better. EMS came out and checked out patient and the note on the EKG that the daughter had stated that there was elevation in V6. Pt declined transport but was encouraged to come to the ER for cardiac work up. Pt states that she had an episode of dizziness yesterday morning but it passed. Pt denied radiation, shob, n/v or dizziness during chest pain episode. While this RN was escorting patient into her room the pt stated "I feel dizzy" Pt noted to drop her hand bag into the floor, head went back, agonal RR's noted, pt went pulseless, pt placed in bed by this RN and compressions initiated immediately. Pt down approximately 20 seconds prior to first compression initiatted. This RN called the pt's daughter on the phone and pt is full code, now on phone with Dr Ronnald Nian. Only complaint that pt had prior to event was that she was hungry from not eating this morning. Before pt was taken back to room pt denied cough, fever, bodyaches, recent travel or being exposed to anyone sick.

## 2019-02-20 DEATH — deceased

## 2019-03-18 ENCOUNTER — Ambulatory Visit: Payer: Medicare Other | Admitting: Internal Medicine

## 2019-06-08 ENCOUNTER — Ambulatory Visit: Payer: Medicare Other
